# Patient Record
Sex: Female | Born: 2000 | Race: Black or African American | Hispanic: No | Marital: Single
Health system: Southern US, Community
[De-identification: ages and names within clinical notes are randomized; demographics above are authoritative.]

## PROBLEM LIST (undated history)

## (undated) ENCOUNTER — Inpatient Hospital Stay (HOSPITAL_COMMUNITY): Payer: Self-pay

## (undated) DIAGNOSIS — Z8709 Personal history of other diseases of the respiratory system: Secondary | ICD-10-CM

## (undated) DIAGNOSIS — H5010 Unspecified exotropia: Secondary | ICD-10-CM

## (undated) DIAGNOSIS — J302 Other seasonal allergic rhinitis: Secondary | ICD-10-CM

## (undated) DIAGNOSIS — Z789 Other specified health status: Secondary | ICD-10-CM

## (undated) DIAGNOSIS — J020 Streptococcal pharyngitis: Secondary | ICD-10-CM

## (undated) DIAGNOSIS — J45909 Unspecified asthma, uncomplicated: Secondary | ICD-10-CM

---
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
## 2021-10-08 HISTORY — PX: FOOT SURGERY: SHX648

## 2023-12-30 ENCOUNTER — Encounter (HOSPITAL_BASED_OUTPATIENT_CLINIC_OR_DEPARTMENT_OTHER): Payer: Self-pay | Admitting: Emergency Medicine

## 2023-12-30 ENCOUNTER — Emergency Department (HOSPITAL_BASED_OUTPATIENT_CLINIC_OR_DEPARTMENT_OTHER)
Admission: EM | Admit: 2023-12-30 | Discharge: 2023-12-31 | Disposition: A | Discharge status: Home / Self Care | Payer: Self-pay | Attending: Emergency Medicine | Admitting: Emergency Medicine

## 2023-12-30 ENCOUNTER — Other Ambulatory Visit: Payer: Self-pay

## 2023-12-30 DIAGNOSIS — N179 Acute kidney failure, unspecified: Secondary | ICD-10-CM | POA: Insufficient documentation

## 2023-12-30 DIAGNOSIS — R112 Nausea with vomiting, unspecified: Secondary | ICD-10-CM

## 2023-12-30 DIAGNOSIS — K30 Functional dyspepsia: Secondary | ICD-10-CM | POA: Insufficient documentation

## 2023-12-30 DIAGNOSIS — R1012 Left upper quadrant pain: Secondary | ICD-10-CM | POA: Diagnosis present

## 2023-12-30 DIAGNOSIS — E860 Dehydration: Secondary | ICD-10-CM | POA: Diagnosis not present

## 2023-12-30 LAB — COMPREHENSIVE METABOLIC PANEL
ALT: 29 U/L (ref 0–44)
AST: 70 U/L — ABNORMAL HIGH (ref 15–41)
Albumin: 4.7 g/dL (ref 3.5–5.0)
Alkaline Phosphatase: 50 U/L (ref 38–126)
Anion gap: 17 — ABNORMAL HIGH (ref 5–15)
BUN: 17 mg/dL (ref 6–20)
CO2: 19 mmol/L — ABNORMAL LOW (ref 22–32)
Calcium: 9.6 mg/dL (ref 8.9–10.3)
Chloride: 101 mmol/L (ref 98–111)
Creatinine, Ser: 1.3 mg/dL — ABNORMAL HIGH (ref 0.44–1.00)
GFR, Estimated: 60 mL/min — ABNORMAL LOW (ref >60)
Glucose, Bld: 113 mg/dL — ABNORMAL HIGH (ref 70–99)
Potassium: 3.7 mmol/L (ref 3.5–5.1)
Sodium: 137 mmol/L (ref 135–145)
Total Bilirubin: 1.2 mg/dL (ref 0.0–1.2)
Total Protein: 8.2 g/dL — ABNORMAL HIGH (ref 6.5–8.1)

## 2023-12-30 LAB — CBC
HCT: 38.7 % (ref 36.0–46.0)
Hemoglobin: 12.7 g/dL (ref 12.0–15.0)
MCH: 26.7 pg (ref 26.0–34.0)
MCHC: 32.8 g/dL (ref 30.0–36.0)
MCV: 81.3 fL (ref 80.0–100.0)
Platelets: 389 10*3/uL (ref 150–400)
RBC: 4.76 MIL/uL (ref 3.87–5.11)
RDW: 13.8 % (ref 11.5–15.5)
WBC: 13.4 10*3/uL — ABNORMAL HIGH (ref 4.0–10.5)
nRBC: 0 % (ref 0.0–0.2)

## 2023-12-30 LAB — HCG, SERUM, QUALITATIVE: Preg, Serum: NEGATIVE

## 2023-12-30 LAB — LIPASE, BLOOD: Lipase: 33 U/L (ref 11–51)

## 2023-12-30 MED ORDER — LACTATED RINGERS IV BOLUS
1000.0000 mL | Freq: Once | INTRAVENOUS | Status: AC
  Administered 2023-12-30: 1000 mL via INTRAVENOUS

## 2023-12-30 MED ORDER — METOCLOPRAMIDE HCL 5 MG/ML IJ SOLN
10.0000 mg | Freq: Once | INTRAMUSCULAR | Status: AC
  Administered 2023-12-30: 10 mg via INTRAVENOUS
  Filled 2023-12-30: qty 2

## 2023-12-30 MED ORDER — MORPHINE SULFATE (PF) 4 MG/ML IV SOLN
4.0000 mg | Freq: Once | INTRAVENOUS | Status: AC
  Administered 2023-12-30: 4 mg via INTRAVENOUS
  Filled 2023-12-30: qty 1

## 2023-12-30 MED ORDER — ALUM & MAG HYDROXIDE-SIMETH 200-200-20 MG/5ML PO SUSP
30.0000 mL | Freq: Once | ORAL | Status: AC
  Administered 2023-12-30: 30 mL via ORAL
  Filled 2023-12-30: qty 30

## 2023-12-30 MED ORDER — ONDANSETRON 4 MG PO TBDP
4.0000 mg | ORAL_TABLET | Freq: Once | ORAL | Status: AC | PRN
  Administered 2023-12-30: 4 mg via ORAL
  Filled 2023-12-30: qty 1

## 2023-12-30 MED ORDER — HALOPERIDOL LACTATE 5 MG/ML IJ SOLN
5.0000 mg | Freq: Once | INTRAMUSCULAR | Status: AC
  Administered 2023-12-30: 5 mg via INTRAVENOUS
  Filled 2023-12-30: qty 1

## 2023-12-30 NOTE — ED Provider Notes (Signed)
 [place] [facility] Provider Note   CSN: [id number] Arrival date & time: 12/30/23  1703     History  Chief Complaint  Patient presents with   Emesis    Stephanie Glenn is a 23 y.o. female.  HPI 23 year old female presents with left upper quadrant pain and vomiting.  Symptoms started a little bit yesterday but really got bad today.  She states she has been having blood when she vomits today as well as a burning in her left upper quadrant.  No fevers, chest pain, urinary symptoms or diarrhea.  She states she got drunk a few days ago but typically does not drink every day.  She denies any smoking or drug use.  Home Medications Prior to Admission medications   Medication Sig Start Date End Date Taking? Authorizing Provider  cetirizine (ZYRTEC) 10 MG tablet Take 10 mg by mouth daily.    [provider]  FLUoxetine (PROZAC) 10 MG capsule Take 10 mg by mouth daily.    [provider]  ibuprofen (ADVIL,MOTRIN) 600 MG tablet Take 1 tablet (600 mg total) by mouth every 6 (six) hours as needed. 10/04/15   Raeford Razor, MD  nystatin (MYCOSTATIN/NYSTOP) powder Apply topically 4 (four) times daily. 02/16/17   Cristina Gong, PA-C  ondansetron (ZOFRAN) 4 MG tablet Take 1 tablet (4 mg total) by mouth every 6 (six) hours. 09/17/16   Roxy Horseman, PA-C  traMADol (ULTRAM) 50 MG tablet Take 1 tablet (50 mg total) by mouth every 6 (six) hours as needed. 10/04/15   Raeford Razor, MD      Allergies    Patient has no known allergies.    Review of Systems   Review of Systems  Constitutional:  Negative for fever.  Respiratory:  Negative for shortness of breath.   Cardiovascular:  Negative for chest pain.  Gastrointestinal:  Positive for abdominal pain, nausea and vomiting. Negative for diarrhea.  Genitourinary:  Negative for dysuria.    Physical Exam Updated Vital Signs BP 129/74   Pulse 68   Temp 98.1 F (36.7 C)   Resp 18   Ht  5\' 1"  (1.549 m)   Wt 74.8 kg   LMP 11/14/2023 (Approximate)   SpO2 100%   BMI 31.18 kg/m  Physical Exam Vitals and nursing note reviewed.  Constitutional:      General: She is not in acute distress.    Appearance: She is well-developed. She is not ill-appearing or diaphoretic.  HENT:     Head: Normocephalic and atraumatic.  Cardiovascular:     Rate and Rhythm: Normal rate and regular rhythm.     Heart sounds: Normal heart sounds.  Pulmonary:     Effort: Pulmonary effort is normal.     Breath sounds: Normal breath sounds.  Abdominal:     Palpations: Abdomen is soft.     Tenderness: There is abdominal tenderness in the left upper quadrant.  Skin:    General: Skin is warm and dry.  Neurological:     Mental Status: She is alert.     ED Results / Procedures / Treatments   Labs (all labs ordered are listed, but only abnormal results are displayed) Labs Reviewed  COMPREHENSIVE METABOLIC PANEL - Abnormal; Notable for the following components:      Result Value   CO2 19 (*)    Glucose, Bld 113 (*)    Creatinine, Ser 1.30 (*)    Total Protein 8.2 (*)    AST 70 (*)  GFR, Estimated 60 (*)    Anion gap 17 (*)    All other components within normal limits  CBC - Abnormal; Notable for the following components:   WBC 13.4 (*)    All other components within normal limits  LIPASE, BLOOD  HCG, SERUM, QUALITATIVE  URINALYSIS, ROUTINE W REFLEX MICROSCOPIC  PREGNANCY, URINE  RAPID URINE DRUG SCREEN, HOSP PERFORMED    EKG EKG Interpretation Date/Time:  Monday December 30 2023 22:30:37 EDT Ventricular Rate:  53 PR Interval:  122 QRS Duration:  140 QT Interval:  492 QTC Calculation: 462 R Axis:   76  Text Interpretation: Sinus rhythm Nonspecific intraventricular conduction delay Tachycardia is resolved when compared to earlier in the day Confirmed by Pricilla Loveless [phone]) on 12/30/2023 10:31:59 PM  Radiology No results found.  Procedures Procedures    Medications Ordered  in ED Medications  ondansetron (ZOFRAN-ODT) disintegrating tablet 4 mg (4 mg Oral Given 12/30/23 1718)  lactated ringers bolus 1,000 mL (1,000 mLs Intravenous New Bag/Given 12/30/23 2156)  morphine (PF) 4 MG/ML injection 4 mg (4 mg Intravenous Given 12/30/23 2150)  metoCLOPramide (REGLAN) injection 10 mg (10 mg Intravenous Given 12/30/23 2149)  lactated ringers bolus 1,000 mL (1,000 mLs Intravenous New Bag/Given 12/30/23 2156)  alum & mag hydroxide-simeth (MAALOX/MYLANTA) 200-200-20 MG/5ML suspension 30 mL (30 mLs Oral Given 12/30/23 2250)  haloperidol lactate (HALDOL) injection 5 mg (5 mg Intravenous Given 12/30/23 2250)    ED Course/ Medical Decision Making/ A&P                                 Medical Decision Making Amount and/or Complexity of Data Reviewed External Data Reviewed: labs, radiology and notes.    Details: Negative CT 12/07/23 Labs: ordered.    Details: Mild AKI ECG/medicine tests: ordered and independent interpretation performed.    Details: Normal QTC when HR normal  Risk OTC drugs. Prescription drug management.   Patient presents with vomiting.  She reports hematemesis though more likely this was a Mallory-Weiss injury given her normal BUN, normal hemoglobin compared to earlier in the month, and no hematemesis here in the emergency department.  She was given morphine and Reglan though still does not feel great and has continued to complain of left upper quadrant pain.  She had a negative CT with what sounds like similar symptoms earlier in the month at an outside ER [facility].  Will give her IV Haldol and a GI cocktail and she will need reassessment. Care transferred to Dr. Wallace Cullens.        Final Clinical Impression(s) / ED Diagnoses Final diagnoses:  None    Rx / DC Orders ED Discharge Orders     None         Pricilla Loveless, MD 12/30/23 2317

## 2023-12-30 NOTE — ED Notes (Signed)
 Informed pt of need for urine sample, states unable to go at this time, will ctm

## 2023-12-30 NOTE — ED Triage Notes (Signed)
 Pt BIBA from work- c/o vomitting after eating wing stop, reports L side abd pain/burning.  Denies known sick contacts.    While attempting to take pt to triage room from hallway, pt states she's feeling faint, sits on floor, assisted to stand and directed to sit in chair, pt proceeds to assume fetal position in chair.  When asked to sit in chair appropriately, pt refuses saying she is in too much pain.    Pt then asks if she is going straight to room, RN informed her there are no rooms available, pt reports "fine, Ill probably just go home"

## 2023-12-30 NOTE — ED Notes (Signed)
 Patient witnessed sticking her finger down her throat while in the lobby.  Patient removed finger after making eye contact with this RN.

## 2023-12-31 LAB — URINALYSIS, ROUTINE W REFLEX MICROSCOPIC
Glucose, UA: NEGATIVE mg/dL
Hgb urine dipstick: NEGATIVE
Ketones, ur: >=80 mg/dL — AB
Leukocytes,Ua: NEGATIVE
Nitrite: NEGATIVE
Protein, ur: 100 mg/dL — AB
Specific Gravity, Urine: 1.02 (ref 1.005–1.030)
pH: 7 (ref 5.0–8.0)

## 2023-12-31 LAB — URINALYSIS, MICROSCOPIC (REFLEX)

## 2023-12-31 LAB — RAPID URINE DRUG SCREEN, HOSP PERFORMED
Amphetamines: NOT DETECTED
Barbiturates: NOT DETECTED
Benzodiazepines: NOT DETECTED
Cocaine: NOT DETECTED
Opiates: POSITIVE — AB
Tetrahydrocannabinol: POSITIVE — AB

## 2023-12-31 LAB — PREGNANCY, URINE: Preg Test, Ur: NEGATIVE

## 2023-12-31 MED ORDER — PANTOPRAZOLE SODIUM 20 MG PO TBEC: 20.0000 mg | DELAYED_RELEASE_TABLET | Freq: Every day | ORAL | 0 refills | Status: DC

## 2023-12-31 MED ORDER — ONDANSETRON HCL 4 MG PO TABS: 4.0000 mg | ORAL_TABLET | Freq: Three times a day (TID) | ORAL | 0 refills | Status: DC | PRN

## 2023-12-31 MED ORDER — SUCRALFATE 1 G PO TABS: 1.0000 g | ORAL_TABLET | Freq: Three times a day (TID) | ORAL | 0 refills | Status: DC

## 2023-12-31 MED ORDER — CALCIUM CARBONATE ANTACID 500 MG PO CHEW
2.0000 | CHEWABLE_TABLET | Freq: Once | ORAL | Status: AC
  Administered 2023-12-31: 400 mg via ORAL
  Filled 2023-12-31: qty 2

## 2023-12-31 NOTE — ED Provider Notes (Signed)
  Provider Note MRN:  [id number]  Arrival date & time: 12/31/23    ED Course and Medical Decision Making   See note from prior team for complete details, in brief:  Clinical Course as of 12/31/23 0205  Mon Dec 30, 2023  2317 Handoff SG 23 yo female here w/ n/v/dehydration Some improvement w/ reglan/morphine/haldol/fluids Pending po chall    [SG]  Tue Dec 31, 2023  0132 Feeling better on recheck, tolerating PO. No further emesis. Has a burning sensation to epigastrium and back of throat, give tums. Stable for dc [SG]    Clinical Course User Index [SG] Sloan Leiter, DO   Reviewed CTAP from 12/07/23 - stable Cr from 12/07/23 was 0.99   The patient improved significantly and was discharged in stable condition. Detailed discussions were had with the patient/guardian regarding current findings, and need for close f/u with PCP or on call doctor. The patient/guardian has been instructed to return immediately if the symptoms worsen in any way for re-evaluation. Patient/guardian verbalized understanding and is in agreement with current care plan. All questions answered prior to discharge.      Procedures  Final Clinical Impressions(s) / ED Diagnoses     ICD-10-CM   1. Nausea and vomiting, unspecified vomiting type  R11.2     2. Indigestion  K30     3. Dehydration  E86.0       ED Discharge Orders          Ordered    sucralfate (CARAFATE) 1 g tablet  3 times daily with meals        12/31/23 0203    pantoprazole (PROTONIX) 20 MG tablet  Daily        12/31/23 0203    ondansetron (ZOFRAN) 4 MG tablet  Every 8 hours PRN        12/31/23 0203              Discharge Instructions      It was a pleasure caring for you today in the emergency department.  You should return to the hospital if you experience return of persistent nausea and vomiting that does not resolve and does not allow you to tolerate any food or fluids, persistent fevers for greater than 2-3 more days,  increasing abdominal pain that persists despite medications, persistent diarrhea, dizziness, syncope (fainting), or for any other concerns.    Please return to the emergency department immediately for any new or concerning symptoms, or if you get worse.         Sloan Leiter, DO 12/31/23 0205

## 2023-12-31 NOTE — Discharge Instructions (Addendum)
It was a pleasure caring for you today in the emergency department. ? ?You should return to the hospital if you experience return of persistent nausea and vomiting that does not resolve and does not allow you to tolerate any food or fluids, persistent fevers for greater than 2-3 more days, increasing abdominal pain that persists despite medications, persistent diarrhea, dizziness, syncope (fainting), or for any other concerns.  ?  ?Please return to the emergency department immediately for any new or concerning symptoms, or if you get worse. ?

## 2023-12-31 NOTE — ED Notes (Signed)
 Pt declines PO challenge, states she is "afraid to eat or drink" and that her stomach still hurts; provider notified  Pt informed of need for urine sample, still reports not need to go at this time

## 2024-01-02 ENCOUNTER — Emergency Department (HOSPITAL_BASED_OUTPATIENT_CLINIC_OR_DEPARTMENT_OTHER): Payer: Self-pay

## 2024-01-02 ENCOUNTER — Inpatient Hospital Stay (HOSPITAL_BASED_OUTPATIENT_CLINIC_OR_DEPARTMENT_OTHER)
Admission: EM | Admit: 2024-01-02 | Discharge: 2024-01-04 | DRG: 392 | Disposition: A | Discharge status: Home / Self Care | Payer: Self-pay | Attending: Family Medicine | Admitting: Family Medicine

## 2024-01-02 ENCOUNTER — Other Ambulatory Visit: Payer: Self-pay

## 2024-01-02 DIAGNOSIS — R824 Acetonuria: Secondary | ICD-10-CM | POA: Diagnosis present

## 2024-01-02 DIAGNOSIS — E876 Hypokalemia: Secondary | ICD-10-CM | POA: Diagnosis present

## 2024-01-02 DIAGNOSIS — F19139 Other psychoactive substance abuse with withdrawal, unspecified: Secondary | ICD-10-CM | POA: Diagnosis present

## 2024-01-02 DIAGNOSIS — Z1152 Encounter for screening for COVID-19: Secondary | ICD-10-CM

## 2024-01-02 DIAGNOSIS — E860 Dehydration: Secondary | ICD-10-CM | POA: Diagnosis present

## 2024-01-02 DIAGNOSIS — Z79899 Other long term (current) drug therapy: Secondary | ICD-10-CM

## 2024-01-02 DIAGNOSIS — R079 Chest pain, unspecified: Secondary | ICD-10-CM | POA: Diagnosis present

## 2024-01-02 DIAGNOSIS — R109 Unspecified abdominal pain: Secondary | ICD-10-CM | POA: Diagnosis present

## 2024-01-02 DIAGNOSIS — R1012 Left upper quadrant pain: Secondary | ICD-10-CM

## 2024-01-02 DIAGNOSIS — M6282 Rhabdomyolysis: Secondary | ICD-10-CM | POA: Diagnosis present

## 2024-01-02 DIAGNOSIS — R112 Nausea with vomiting, unspecified: Principal | ICD-10-CM | POA: Diagnosis present

## 2024-01-02 DIAGNOSIS — A084 Viral intestinal infection, unspecified: Principal | ICD-10-CM | POA: Diagnosis present

## 2024-01-02 DIAGNOSIS — K529 Noninfective gastroenteritis and colitis, unspecified: Secondary | ICD-10-CM | POA: Diagnosis present

## 2024-01-02 LAB — CBC
HCT: 36.5 % (ref 36.0–46.0)
Hemoglobin: 11.9 g/dL — ABNORMAL LOW (ref 12.0–15.0)
MCH: 26.6 pg (ref 26.0–34.0)
MCHC: 32.6 g/dL (ref 30.0–36.0)
MCV: 81.7 fL (ref 80.0–100.0)
Platelets: 314 K/uL (ref 150–400)
RBC: 4.47 MIL/uL (ref 3.87–5.11)
RDW: 13.8 % (ref 11.5–15.5)
WBC: 10.4 K/uL (ref 4.0–10.5)
nRBC: 0 % (ref 0.0–0.2)

## 2024-01-02 LAB — URINALYSIS, MICROSCOPIC (REFLEX): WBC, UA: NONE SEEN WBC/hpf (ref 0–5)

## 2024-01-02 LAB — COMPREHENSIVE METABOLIC PANEL WITH GFR
ALT: 30 U/L (ref 0–44)
AST: 48 U/L — ABNORMAL HIGH (ref 15–41)
Albumin: 4.2 g/dL (ref 3.5–5.0)
Alkaline Phosphatase: 46 U/L (ref 38–126)
Anion gap: 13 (ref 5–15)
BUN: 8 mg/dL (ref 6–20)
CO2: 19 mmol/L — ABNORMAL LOW (ref 22–32)
Calcium: 9.3 mg/dL (ref 8.9–10.3)
Chloride: 105 mmol/L (ref 98–111)
Creatinine, Ser: 0.87 mg/dL (ref 0.44–1.00)
GFR, Estimated: >60 mL/min
Glucose, Bld: 111 mg/dL — ABNORMAL HIGH (ref 70–99)
Potassium: 3.2 mmol/L — ABNORMAL LOW (ref 3.5–5.1)
Sodium: 137 mmol/L (ref 135–145)
Total Bilirubin: 0.8 mg/dL (ref 0.0–1.2)
Total Protein: 7.5 g/dL (ref 6.5–8.1)

## 2024-01-02 LAB — RESP PANEL BY RT-PCR (RSV, FLU A&B, COVID)  RVPGX2
Influenza A by PCR: NEGATIVE
Influenza B by PCR: NEGATIVE
Resp Syncytial Virus by PCR: NEGATIVE
SARS Coronavirus 2 by RT PCR: NEGATIVE

## 2024-01-02 LAB — LIPASE, BLOOD: Lipase: 26 U/L (ref 11–51)

## 2024-01-02 LAB — BASIC METABOLIC PANEL WITH GFR
Anion gap: 8 (ref 5–15)
BUN: 6 mg/dL (ref 6–20)
CO2: 21 mmol/L — ABNORMAL LOW (ref 22–32)
Calcium: 8.9 mg/dL (ref 8.9–10.3)
Chloride: 107 mmol/L (ref 98–111)
Creatinine, Ser: 0.92 mg/dL (ref 0.44–1.00)
GFR, Estimated: >60 mL/min (ref >60)
Glucose, Bld: 97 mg/dL (ref 70–99)
Potassium: 3.2 mmol/L — ABNORMAL LOW (ref 3.5–5.1)
Sodium: 136 mmol/L (ref 135–145)

## 2024-01-02 LAB — URINALYSIS, ROUTINE W REFLEX MICROSCOPIC
Bilirubin Urine: NEGATIVE
Glucose, UA: NEGATIVE mg/dL
Hgb urine dipstick: NEGATIVE
Ketones, ur: 40 mg/dL — AB
Leukocytes,Ua: NEGATIVE
Nitrite: NEGATIVE
Protein, ur: 100 mg/dL — AB
Specific Gravity, Urine: 1.02 (ref 1.005–1.030)
pH: >=9 (ref 5.0–8.0)

## 2024-01-02 LAB — PHOSPHORUS: Phosphorus: 3 mg/dL (ref 2.5–4.6)

## 2024-01-02 LAB — ETHANOL: Alcohol, Ethyl (B): <10 mg/dL (ref <10)

## 2024-01-02 LAB — MAGNESIUM: Magnesium: 1.9 mg/dL (ref 1.7–2.4)

## 2024-01-02 LAB — LACTIC ACID, PLASMA: Lactic Acid, Venous: 0.8 mmol/L (ref 0.5–1.9)

## 2024-01-02 LAB — PREGNANCY, URINE: Preg Test, Ur: NEGATIVE

## 2024-01-02 LAB — CK: Total CK: 1267 U/L — ABNORMAL HIGH (ref 38–234)

## 2024-01-02 LAB — TSH: TSH: 0.797 u[IU]/mL (ref 0.350–4.500)

## 2024-01-02 MED ORDER — DIPHENHYDRAMINE HCL 50 MG/ML IJ SOLN
25.0000 mg | Freq: Once | INTRAMUSCULAR | Status: AC
  Administered 2024-01-02: 25 mg via INTRAVENOUS
  Filled 2024-01-02: qty 1

## 2024-01-02 MED ORDER — PANTOPRAZOLE SODIUM 40 MG IV SOLR
40.0000 mg | Freq: Once | INTRAVENOUS | Status: AC
  Administered 2024-01-02: 40 mg via INTRAVENOUS
  Filled 2024-01-02: qty 10

## 2024-01-02 MED ORDER — ACETAMINOPHEN 650 MG RE SUPP: 650.0000 mg | Freq: Four times a day (QID) | RECTAL | Status: DC | PRN

## 2024-01-02 MED ORDER — HYDROCODONE-ACETAMINOPHEN 5-325 MG PO TABS
1.0000 | ORAL_TABLET | ORAL | Status: DC | PRN
  Administered 2024-01-02: 2 via ORAL
  Filled 2024-01-02: qty 2

## 2024-01-02 MED ORDER — ALUM & MAG HYDROXIDE-SIMETH 200-200-20 MG/5ML PO SUSP
30.0000 mL | Freq: Once | ORAL | Status: AC
  Administered 2024-01-02: 30 mL via ORAL
  Filled 2024-01-02: qty 30

## 2024-01-02 MED ORDER — PANTOPRAZOLE SODIUM 40 MG IV SOLR
40.0000 mg | Freq: Two times a day (BID) | INTRAVENOUS | Status: DC
  Administered 2024-01-02 – 2024-01-04 (×4): 40 mg via INTRAVENOUS
  Filled 2024-01-02 (×4): qty 10

## 2024-01-02 MED ORDER — SODIUM CHLORIDE 0.9 % IV SOLN: INTRAVENOUS | Status: AC

## 2024-01-02 MED ORDER — LACTATED RINGERS IV BOLUS
1000.0000 mL | Freq: Once | INTRAVENOUS | Status: AC
  Administered 2024-01-02: 1000 mL via INTRAVENOUS

## 2024-01-02 MED ORDER — IOHEXOL 300 MG/ML  SOLN
100.0000 mL | Freq: Once | INTRAMUSCULAR | Status: AC | PRN
  Administered 2024-01-02: 100 mL via INTRAVENOUS

## 2024-01-02 MED ORDER — DROPERIDOL 2.5 MG/ML IJ SOLN
0.6250 mg | Freq: Once | INTRAMUSCULAR | Status: AC
  Administered 2024-01-02: 0.625 mg via INTRAVENOUS
  Filled 2024-01-02: qty 2

## 2024-01-02 MED ORDER — ONDANSETRON HCL 4 MG PO TABS: 4.0000 mg | ORAL_TABLET | Freq: Four times a day (QID) | ORAL | Status: DC | PRN

## 2024-01-02 MED ORDER — METOCLOPRAMIDE HCL 5 MG/ML IJ SOLN
5.0000 mg | Freq: Once | INTRAMUSCULAR | Status: AC
  Administered 2024-01-02: 5 mg via INTRAVENOUS
  Filled 2024-01-02: qty 2

## 2024-01-02 MED ORDER — SUCRALFATE 1 G PO TABS
1.0000 g | ORAL_TABLET | Freq: Three times a day (TID) | ORAL | Status: DC
Start: 2024-01-03 — End: 2024-01-04
  Administered 2024-01-03: 1 g via ORAL
  Filled 2024-01-02 (×3): qty 1

## 2024-01-02 MED ORDER — METOCLOPRAMIDE HCL 5 MG/ML IJ SOLN
5.0000 mg | Freq: Four times a day (QID) | INTRAMUSCULAR | Status: DC | PRN
  Filled 2024-01-02: qty 2

## 2024-01-02 MED ORDER — ONDANSETRON HCL 4 MG/2ML IJ SOLN
4.0000 mg | Freq: Four times a day (QID) | INTRAMUSCULAR | Status: DC | PRN
  Administered 2024-01-02 – 2024-01-04 (×3): 4 mg via INTRAVENOUS
  Filled 2024-01-02 (×3): qty 2

## 2024-01-02 MED ORDER — ONDANSETRON 4 MG PO TBDP: 4.0000 mg | ORAL_TABLET | Freq: Once | ORAL | Status: DC | PRN

## 2024-01-02 MED ORDER — ACETAMINOPHEN 325 MG PO TABS: 650.0000 mg | ORAL_TABLET | Freq: Four times a day (QID) | ORAL | Status: DC | PRN

## 2024-01-02 MED ORDER — POTASSIUM CHLORIDE CRYS ER 20 MEQ PO TBCR
40.0000 meq | EXTENDED_RELEASE_TABLET | Freq: Once | ORAL | Status: AC
  Administered 2024-01-02: 40 meq via ORAL
  Filled 2024-01-02: qty 2

## 2024-01-02 MED ORDER — TRAMADOL HCL 50 MG PO TABS: 50.0000 mg | ORAL_TABLET | Freq: Four times a day (QID) | ORAL | Status: DC | PRN

## 2024-01-02 NOTE — Assessment & Plan Note (Signed)
 rehydrate and follow fluid status

## 2024-01-02 NOTE — Assessment & Plan Note (Signed)
 Potassium supplement given at outside ER repeat labs and continue to replace as needed

## 2024-01-02 NOTE — ED Triage Notes (Signed)
 Pt POV steady gait- c/o ongoing abd pain, emesis, chest pain, light headed, chills.  Reports recently seen for same, unable to keep meds down.    Poor po intake.

## 2024-01-02 NOTE — ED Provider Notes (Signed)
 [place] [facility] Provider Note   CSN: [id number] Arrival date & time: 01/02/24  1311     History  Chief Complaint  Patient presents with   Abdominal Pain   Chest Pain    Stephanie Glenn is a 23 y.o. female with past medical history of history of asthma presenting with epigastric tenderness.  Patient reports she has intermittently been dealing with this for approximately 1 week seems to be getting worse.  Was seen here yesterday for similar.  Symptoms resolved after Haldol.  She is no longer tolerating oral intake today.  Reports she has had several episodes of nonbilious nonbloody emesis today.  Reports this initially tries to drink or eat anything she immediately vomits it back up.  Seems to have noted small blood in vomit yesterday.  No melena or hematochezia.  Pain is intermittent.  Nausea is constant.  Patient actively retching in room.  Denies fevers, chills.  Denies any recent travel.  Denies recent antibiotic use.  Denies any chest pain, shortness of breath.    Abdominal Pain Associated symptoms: no chest pain   Chest Pain Associated symptoms: abdominal pain        Home Medications Prior to Admission medications   Medication Sig Start Date End Date Taking? Authorizing Provider  cetirizine (ZYRTEC) 10 MG tablet Take 10 mg by mouth daily.    [provider]  FLUoxetine (PROZAC) 10 MG capsule Take 10 mg by mouth daily.    [provider]  ibuprofen (ADVIL,MOTRIN) 600 MG tablet Take 1 tablet (600 mg total) by mouth every 6 (six) hours as needed. 10/04/15   Raeford Razor, MD  nystatin (MYCOSTATIN/NYSTOP) powder Apply topically 4 (four) times daily. 02/16/17   Cristina Gong, PA-C  ondansetron (ZOFRAN) 4 MG tablet Take 1 tablet (4 mg total) by mouth every 8 (eight) hours as needed. 12/31/23   Sloan Leiter, DO  pantoprazole (PROTONIX) 20 MG tablet Take 1 tablet (20 mg total) by mouth daily for 14 days. 12/31/23 01/14/24   Sloan Leiter, DO  sucralfate (CARAFATE) 1 g tablet Take 1 tablet (1 g total) by mouth with breakfast, with lunch, and with evening meal for 7 days. 12/31/23 01/07/24  Sloan Leiter, DO  traMADol (ULTRAM) 50 MG tablet Take 1 tablet (50 mg total) by mouth every 6 (six) hours as needed. 10/04/15   Raeford Razor, MD      Allergies    Patient has no known allergies.    Review of Systems   Review of Systems  Cardiovascular:  Negative for chest pain.  Gastrointestinal:  Positive for abdominal pain.    Physical Exam Updated Vital Signs BP 132/80   Pulse 77   Temp 98.7 F (37.1 C)   Resp 15   Ht 5\' 1"  (1.549 m)   Wt 70.8 kg   LMP 11/14/2023 (Approximate)   SpO2 100%   BMI 29.48 kg/m  Physical Exam Vitals and nursing note reviewed.  Constitutional:      General: She is not in acute distress.    Appearance: She is not toxic-appearing.  HENT:     Head: Normocephalic and atraumatic.  Eyes:     General: No scleral icterus.    Conjunctiva/sclera: Conjunctivae normal.  Cardiovascular:     Rate and Rhythm: Normal rate and regular rhythm.     Pulses: Normal pulses.     Heart sounds: Normal heart sounds.  Pulmonary:     Effort: Pulmonary effort is  normal. No respiratory distress.     Breath sounds: Normal breath sounds.  Abdominal:     General: Abdomen is flat. Bowel sounds are normal. There is no distension.     Palpations: Abdomen is soft. There is no mass.     Tenderness: There is no abdominal tenderness.     Comments: Redness is not reproducible on exam.  Has generalized abdominal pain.  Musculoskeletal:     Right lower leg: No edema.     Left lower leg: No edema.  Skin:    General: Skin is warm and dry.     Findings: No lesion.  Neurological:     General: No focal deficit present.     Mental Status: She is alert and oriented to person, place, and time. Mental status is at baseline.     ED Results / Procedures / Treatments   Labs (all labs ordered are listed, but only  abnormal results are displayed) Labs Reviewed  COMPREHENSIVE METABOLIC PANEL WITH GFR - Abnormal; Notable for the following components:      Result Value   Potassium 3.2 (*)    CO2 19 (*)    Glucose, Bld 111 (*)    AST 48 (*)    All other components within normal limits  CBC - Abnormal; Notable for the following components:   Hemoglobin 11.9 (*)    All other components within normal limits  URINALYSIS, ROUTINE W REFLEX MICROSCOPIC - Abnormal; Notable for the following components:   Ketones, ur 40 (*)    Protein, ur 100 (*)    All other components within normal limits  URINALYSIS, MICROSCOPIC (REFLEX) - Abnormal; Notable for the following components:   Bacteria, UA RARE (*)    All other components within normal limits  RESP PANEL BY RT-PCR (RSV, FLU A&B, COVID)  RVPGX2  LIPASE, BLOOD  PREGNANCY, URINE  MAGNESIUM    EKG None  Radiology CT ABDOMEN PELVIS W CONTRAST Result Date: 01/02/2024 CLINICAL DATA:  Abdominal pain. EXAM: CT ABDOMEN AND PELVIS WITH CONTRAST TECHNIQUE: Multidetector CT imaging of the abdomen and pelvis was performed using the standard protocol following bolus administration of intravenous contrast. RADIATION DOSE REDUCTION: This exam was performed according to the departmental dose-optimization program which includes automated exposure control, adjustment of the mA and/or kV according to patient size and/or use of iterative reconstruction technique. CONTRAST:  OMNIPAQUE IOHEXOL 300 MG/ML  SOLN COMPARISON:  CT abdomen pelvis dated 12/07/2023. FINDINGS: Lower chest: The visualized lung bases are clear. No intra-abdominal free air or free fluid. Hepatobiliary: Pancreas: Unremarkable. No pancreatic ductal dilatation or surrounding inflammatory changes. Spleen: Normal in size without focal abnormality. Adrenals/Urinary Tract: The adrenal glands, kidneys, visualized ureters, and urinary bladder appear unremarkable. Stomach/Bowel: No bowel obstruction or active  inflammation. The appendix is normal. Vascular/Lymphatic: The abdominal aorta and IVC are unremarkable. No portal venous gas. There is no adenopathy. Reproductive: The uterus is anteverted. A 3 cm left ovarian dominant follicle or corpus luteum. No imaging follow-up. The right ovary is unremarkable. Other: None Musculoskeletal: No acute or significant osseous findings. IMPRESSION: 1. No acute intra-abdominal or pelvic pathology. 2. A 3 cm left ovarian dominant follicle or corpus luteum. No imaging follow-up. Electronically Signed   By: Elgie Collard M.D.   On: 01/02/2024 18:58   DG Chest 2 View Result Date: 01/02/2024 CLINICAL DATA:  Chest pain emesis EXAM: CHEST - 2 VIEW COMPARISON:  None Available. FINDINGS: The heart size and mediastinal contours are within normal limits. Both lungs  are clear. The visualized skeletal structures are unremarkable. IMPRESSION: No active cardiopulmonary disease. Electronically Signed   By: Jasmine Pang M.D.   On: 01/02/2024 16:30    Procedures Procedures    Medications Ordered in ED Medications  pantoprazole (PROTONIX) injection 40 mg (40 mg Intravenous Given 01/02/24 1627)  metoCLOPramide (REGLAN) injection 5 mg (5 mg Intravenous Given 01/02/24 1633)  lactated ringers bolus 1,000 mL (1,000 mLs Intravenous New Bag/Given 01/02/24 1635)  droperidol (INAPSINE) 2.5 MG/ML injection 0.625 mg (0.625 mg Intravenous Given 01/02/24 1712)  diphenhydrAMINE (BENADRYL) injection 25 mg (25 mg Intravenous Given 01/02/24 1711)  potassium chloride SA (KLOR-CON M) CR tablet 40 mEq (40 mEq Oral Given 01/02/24 1826)  alum & mag hydroxide-simeth (MAALOX/MYLANTA) 200-200-20 MG/5ML suspension 30 mL (30 mLs Oral Given 01/02/24 1826)  iohexol (OMNIPAQUE) 300 MG/ML solution 100 mL (100 mLs Intravenous Contrast Given 01/02/24 1802)    ED Course/ Medical Decision Making/ A&P Clinical Course as of 01/02/24 1909  Thu Jan 02, 2024  1909 Threw up potassium pills. Not tolerating PO, not feeling  better.  [JB]    Clinical Course User Index [JB] Nerissa Constantin, Horald Chestnut, PA-C                                 Medical Decision Making Amount and/or Complexity of Data Reviewed Labs: ordered. Radiology: ordered.  Risk OTC drugs. Prescription drug management. Decision regarding hospitalization.   This patient presents to the ED for concern of nausea, vomit, this involves an extensive number of treatment options, and is a complaint that carries with it a high risk of complications and morbidity.  The differential diagnosis includes pancreatitis, gastritis, gastroenteritis, cholecystitis, choledocholithiasis, small bowel obstruction, electrolyte abnormality, dehydration, medication side effect, appendicitis   Co morbidities that complicate the patient evaluation  Asthma, allergies    Additional history obtained:  Additional history obtained from 01/01/24 seen here yesterday for similar complaint.  Reports she drank alcohol a few days ago.  Apparently was denying cannabis use at this time.  Had EKG but no imaging.  Felt better after receiving Haldol.   Lab Tests:  I personally interpreted labs.  The pertinent results include:   CBC with no leukocyte ptosis, hemoglobin is 11.9.  CMP significant for AST 48, and potassium 3.2 will supplement orally when tolerating oral intake.  Magnesium normal.  Lipase normal pregnancy test negative.  Urine is positive for small amount of ketones and protein which I feel is her to starvation ketosis.   Imaging Studies ordered:  I ordered imaging studies including chest x-ray ordered prior to seeing patient, ct abd/pelvis   I independently visualized and interpreted imaging which showed chest x-ray normal, CT negative   I agree with the radiologist interpretation   Cardiac Monitoring: / EKG:  The patient was maintained on a cardiac monitor.  I personally viewed and interpreted the cardiac monitored which showed an underlying rhythm of: Sinus, normal  QT   Problem List / ED Course / Critical interventions / Medication management  Patient stable and well-appearing.  She has no focal area of abdominal tenderness on exam.  Due to durations of symptoms will obtain imaging.  Labs show no leukocytosis.  She has a potassium of 3.2 EKG without QT prolongation.  Will give fluids for ketones in urine, concern for dehydration and will try to control symptoms.  I ordered medication including initially giving Reglan, Protonix, fluids.  Patient reassessed and was  not feeling better after receiving Reglan continues to have retching and vomiting.  Will give droperidol.  She has normal QT. Patient reassessed after receiving droperidol.  Reports she throw up potassium and is not feeling better at all.  Not tolerating oral intake.  At this point she has had several episodes for similar over the past 4 to 5 days.  She continues to not tolerate oral intake.  Feel she would benefit from obs admission for intractable nausea vomiting. I have reviewed the patients home medicines and have made adjustments as needed   Plan  Admit for intractable nausea vomiting.         Final Clinical Impression(s) / ED Diagnoses Final diagnoses:  Intractable nausea and vomiting    Rx / DC Orders ED Discharge Orders     None         Smitty Knudsen, PA-C 01/02/24 2128    Virgina Norfolk, DO 01/02/24 2244

## 2024-01-02 NOTE — Plan of Care (Signed)
 Plan of Care Note for accepted transfer  Patient: Stephanie Glenn              ZOX:[id number]  DOA: 01/02/2024     Facility requesting transfer: [facility] Requesting Provider: Jasmine Pang, PA  Reason for transfer: Intractable nausea and vomiting, hypokalemia and non-anion gap metabolic acidosis.  Facility course: Stephanie Glenn is a 23 y.o. female with past medical history of history of asthma presenting with epigastric tenderness.  Patient reports she has intermittently been dealing with this for approximately 1 week seems to be getting worse.  Was seen here yesterday for similar.  Symptoms resolved after Haldol.  She is no longer tolerating oral intake today.  Reports she has had several episodes of nonbilious nonbloody emesis today.  Reports this initially tries to drink or eat anything she immediately vomits it back up.  Seems to have noted small blood in vomit yesterday.  No melena or hematochezia.  Pain is intermittent.  Nausea is constant.  Patient actively retching in room.  Denies fevers, chills.  Denies any recent travel.  Denies recent antibiotic use.  Denies any chest pain, shortness of breat   Hemodynamically stable at the ED. CBC unremarkable except slightly low hemoglobin 11.9.  Baseline hemoglobin around 12.7. CMP showing low potassium 3.2, low bicarb 19, elevated AST 48 otherwise unremarkable.  Normal lipase level. Normal mag level.  Pregnancy test negative.  Respiratory panel negative. UA showing positive creatinine and protein positive.   EKG showing normal sinus rhythm heart rate 61.  CT abdomen pelvis no acute intra-abdominal or pelvic pathology.  3 cm left ovarian dominant follicle/corpus luteum.  Chest x-ray unremarkable.  In the ED patient has been given GI cocktail 1 L of LR bolus and oral KCl.  [facility] has been consulted for for admission for intractable nausea and vomiting.   Plan of care: The patient is accepted for admission for observation status to  Med-surg  unit, at [facility].  [facility] will assume care on arrival to accepting facility. Until arrival, care as per EDP. However, [facility] available 24/7 for questions and assistance.   Check [email or web address] for on-call coverage.   Nursing staff, please call [facility] Admits & Consults System-Wide number under Amion on patient's arrival so appropriate admitting provider can evaluate the pt.    Author: Tereasa Coop, MD  01/02/2024  [facility]

## 2024-01-02 NOTE — ED Notes (Signed)
 ED TO INPATIENT HANDOFF REPORT  ED Nurse Name and Phone #: Hayden Pedro RN [phone]  S Name/Age/Gender Stephanie Glenn 23 y.o. female Room/Bed: MH02/MH02  Code Status   Code Status: Not on file  Home/SNF/Other Home Patient oriented to: self, place, time, and situation Is this baseline? Yes   Triage Complete: Triage complete  Chief Complaint Intractable nausea and vomiting [R11.2]  Triage Note Pt POV steady gait- c/o ongoing abd pain, emesis, chest pain, light headed, chills.  Reports recently seen for same, unable to keep meds down.    Poor po intake.    Allergies No Known Allergies  Level of Care/Admitting Diagnosis ED Disposition     ED Disposition  Admit   Condition  --   Comment  Hospital Area: [facility] [[id number]]  Level of Care: Med-Surg [16]  Interfacility transfer: Yes  May place patient in observation at [facility] or [facility] if equivalent level of care is available:: Yes  Covid Evaluation: Asymptomatic - no recent exposure (last 10 days) testing not required  Diagnosis: Intractable nausea and vomiting [720114]  Admitting Physician: Tereasa Coop [[id number]]  Attending Physician: Tereasa Coop [[id number]]          B Medical/Surgery History Past Medical History:  Diagnosis Date   Asthma    Difficult intravenous access    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   History of asthma    as a child   Seasonal allergies    Strep throat    Past Surgical History:  Procedure Laterality Date   STRABISMUS SURGERY Bilateral 07/16/2014   Procedure: REPAIR STRABISMUS PEDIATRIC;  Surgeon: Shara Blazing, MD;  Location: [facility];  Service: Ophthalmology;  Laterality: Bilateral;     A IV Location/Drains/Wounds Patient Lines/Drains/Airways Status     Active Line/Drains/Airways     Name Placement date Placement time Site Days   Peripheral IV 01/02/24 22 G 1" Left;Anterior Forearm 01/02/24  2001  Forearm  less than 1             Intake/Output Last 24 hours  Intake/Output Summary (Last 24 hours) at 01/02/2024 2013 Last data filed at 01/02/2024 2013 Gross per 24 hour  Intake 647.55 ml  Output --  Net 647.55 ml    Labs/Imaging Results for orders placed or performed during the hospital encounter of 01/02/24 (from the past 48 hours)  Urinalysis, Routine w reflex microscopic -Urine, Clean Catch     Status: Abnormal   Collection Time: 01/02/24  1:27 PM  Result Value Ref Range   Color, Urine YELLOW YELLOW   APPearance CLEAR CLEAR   Specific Gravity, Urine 1.020 1.005 - 1.030   pH >=9.0 5.0 - 8.0   Glucose, UA NEGATIVE NEGATIVE mg/dL   Hgb urine dipstick NEGATIVE NEGATIVE   Bilirubin Urine NEGATIVE NEGATIVE   Ketones, ur 40 (A) NEGATIVE mg/dL   Protein, ur 213 (A) NEGATIVE mg/dL   Nitrite NEGATIVE NEGATIVE   Leukocytes,Ua NEGATIVE NEGATIVE    Comment: Performed at [facility], [address] [place], [place] [address]  Urinalysis, Microscopic (reflex)     Status: Abnormal   Collection Time: 01/02/24  1:27 PM  Result Value Ref Range   RBC / HPF 0-5 0 - 5 RBC/hpf   WBC, UA NONE SEEN 0 - 5 WBC/hpf   Bacteria, UA RARE (A) NONE SEEN   Squamous Epithelial / HPF 0-5 0 - 5 /HPF    Comment: Performed at [facility], [address] [place], [place] [address]  Lipase, blood     Status: None   Collection Time: 01/02/24  1:30 PM  Result Value Ref Range   Lipase 26 11 - 51 U/L    Comment: Performed at [facility], [address] [place], [place] [address]  Comprehensive metabolic panel     Status: Abnormal   Collection Time: 01/02/24  1:30 PM  Result Value Ref Range   Sodium 137 135 - 145 mmol/L   Potassium 3.2 (L) 3.5 - 5.1 mmol/L   Chloride 105 98 - 111 mmol/L   CO2 19 (L) 22 - 32 mmol/L   Glucose, Bld 111 (H) 70 - 99 mg/dL    Comment: Glucose reference range applies only to samples taken after fasting for at least 8 hours.   BUN 8 6 - 20 mg/dL    Creatinine, Ser 5.78 0.44 - 1.00 mg/dL   Calcium 9.3 8.9 - 46.9 mg/dL   Total Protein 7.5 6.5 - 8.1 g/dL   Albumin 4.2 3.5 - 5.0 g/dL   AST 48 (H) 15 - 41 U/L   ALT 30 0 - 44 U/L   Alkaline Phosphatase 46 38 - 126 U/L   Total Bilirubin 0.8 0.0 - 1.2 mg/dL   GFR, Estimated >62 >95 mL/min    Comment: (NOTE) Calculated using the CKD-EPI Creatinine Equation (2021)    Anion gap 13 5 - 15    Comment: Performed at [facility], [address] [place], [place] [address]  CBC     Status: Abnormal   Collection Time: 01/02/24  1:30 PM  Result Value Ref Range   WBC 10.4 4.0 - 10.5 K/uL   RBC 4.47 3.87 - 5.11 MIL/uL   Hemoglobin 11.9 (L) 12.0 - 15.0 g/dL   HCT 24.4 01.0 - 27.2 %   MCV 81.7 80.0 - 100.0 fL   MCH 26.6 26.0 - 34.0 pg   MCHC 32.6 30.0 - 36.0 g/dL   RDW 53.6 64.4 - 03.4 %   Platelets 314 150 - 400 K/uL   nRBC 0.0 0.0 - 0.2 %    Comment: Performed at [facility], [address] [place], [place] [address]  Resp panel by RT-PCR (RSV, Flu A&B, Covid) Anterior Nasal Swab     Status: None   Collection Time: 01/02/24  1:30 PM   Specimen: Anterior Nasal Swab  Result Value Ref Range   SARS Coronavirus 2 by RT PCR NEGATIVE NEGATIVE    Comment: (NOTE) SARS-CoV-2 target nucleic acids are NOT DETECTED.  The SARS-CoV-2 RNA is generally detectable in upper respiratory specimens during the acute phase of infection. The lowest concentration of SARS-CoV-2 viral copies this assay can detect is 138 copies/mL. A negative result does not preclude SARS-Cov-2 infection and should not be used as the sole basis for treatment or other patient management decisions. A negative result may occur with  improper specimen collection/handling, submission of specimen other than nasopharyngeal swab, presence of viral mutation(s) within the areas targeted by this assay, and inadequate number of viral copies(<138 copies/mL). A negative result must be combined with clinical  observations, patient history, and epidemiological information. The expected result is Negative.  Fact Sheet for Patients:  BloggerCourse.com  Fact Sheet for Healthcare Providers:  SeriousBroker.it  This test is no t yet approved or cleared by the [place] FDA and  has been authorized for detection and/or diagnosis of SARS-CoV-2 by FDA under an Emergency  Use Authorization (EUA). This EUA will remain  in effect (meaning this test can be used) for the duration of the COVID-19 declaration under Section 564(b)(1) of the Act, 21 U.S.C.section 360bbb-3(b)(1), unless the authorization is terminated  or revoked sooner.       Influenza A by PCR NEGATIVE NEGATIVE   Influenza B by PCR NEGATIVE NEGATIVE    Comment: (NOTE) The Xpert Xpress SARS-CoV-2/FLU/RSV plus assay is intended as an aid in the diagnosis of influenza from Nasopharyngeal swab specimens and should not be used as a sole basis for treatment. Nasal washings and aspirates are unacceptable for Xpert Xpress SARS-CoV-2/FLU/RSV testing.  Fact Sheet for Patients: BloggerCourse.com  Fact Sheet for Healthcare Providers: SeriousBroker.it  This test is not yet approved or cleared by the [place] FDA and has been authorized for detection and/or diagnosis of SARS-CoV-2 by FDA under an Emergency Use Authorization (EUA). This EUA will remain in effect (meaning this test can be used) for the duration of the COVID-19 declaration under Section 564(b)(1) of the Act, 21 U.S.C. section 360bbb-3(b)(1), unless the authorization is terminated or revoked.     Resp Syncytial Virus by PCR NEGATIVE NEGATIVE    Comment: (NOTE) Fact Sheet for Patients: BloggerCourse.com  Fact Sheet for Healthcare Providers: SeriousBroker.it  This test is not yet approved or cleared by the [place]  FDA and has been authorized for detection and/or diagnosis of SARS-CoV-2 by FDA under an Emergency Use Authorization (EUA). This EUA will remain in effect (meaning this test can be used) for the duration of the COVID-19 declaration under Section 564(b)(1) of the Act, 21 U.S.C. section 360bbb-3(b)(1), unless the authorization is terminated or revoked.  Performed at [facility], [address] [place], [place] [address]   Pregnancy, urine     Status: None   Collection Time: 01/02/24  1:59 PM  Result Value Ref Range   Preg Test, Ur NEGATIVE NEGATIVE    Comment:        THE SENSITIVITY OF THIS METHODOLOGY IS >25 mIU/mL. Performed at [facility], [address] [place], [place] [address]   Magnesium     Status: None   Collection Time: 01/02/24  4:09 PM  Result Value Ref Range   Magnesium 1.9 1.7 - 2.4 mg/dL    Comment: Performed at [facility], [address] [place], [place] [address]   CT ABDOMEN PELVIS W CONTRAST Result Date: 01/02/2024 CLINICAL DATA:  Abdominal pain. EXAM: CT ABDOMEN AND PELVIS WITH CONTRAST TECHNIQUE: Multidetector CT imaging of the abdomen and pelvis was performed using the standard protocol following bolus administration of intravenous contrast. RADIATION DOSE REDUCTION: This exam was performed according to the departmental dose-optimization program which includes automated exposure control, adjustment of the mA and/or kV according to patient size and/or use of iterative reconstruction technique. CONTRAST:  OMNIPAQUE IOHEXOL 300 MG/ML  SOLN COMPARISON:  CT abdomen pelvis dated 12/07/2023. FINDINGS: Lower chest: The visualized lung bases are clear. No intra-abdominal free air or free fluid. Hepatobiliary: Pancreas: Unremarkable. No pancreatic ductal dilatation or surrounding inflammatory changes. Spleen: Normal in size without focal abnormality. Adrenals/Urinary Tract: The adrenal glands, kidneys, visualized ureters, and urinary  bladder appear unremarkable. Stomach/Bowel: No bowel obstruction or active inflammation. The appendix is normal. Vascular/Lymphatic: The abdominal aorta and IVC are unremarkable. No portal venous gas. There is no adenopathy. Reproductive: The uterus is anteverted. A 3 cm left ovarian dominant follicle or corpus luteum. No imaging follow-up. The right  ovary is unremarkable. Other: None Musculoskeletal: No acute or significant osseous findings. IMPRESSION: 1. No acute intra-abdominal or pelvic pathology. 2. A 3 cm left ovarian dominant follicle or corpus luteum. No imaging follow-up. Electronically Signed   By: Elgie Collard M.D.   On: 01/02/2024 18:58   DG Chest 2 View Result Date: 01/02/2024 CLINICAL DATA:  Chest pain emesis EXAM: CHEST - 2 VIEW COMPARISON:  None Available. FINDINGS: The heart size and mediastinal contours are within normal limits. Both lungs are clear. The visualized skeletal structures are unremarkable. IMPRESSION: No active cardiopulmonary disease. Electronically Signed   By: Jasmine Pang M.D.   On: 01/02/2024 16:30    Pending Labs Unresulted Labs (From admission, onward)    None       Vitals/Pain Today's Vitals   01/02/24 1325 01/02/24 1622 01/02/24 1714 01/02/24 1715  BP: 132/80 120/70    Pulse: 77 66    Resp: 15 15    Temp: 98.7 F (37.1 C)  98.5 F (36.9 C)   TempSrc:   Oral   SpO2: 100% 100%    Weight:      Height:      PainSc:    3     Isolation Precautions No active isolations  Medications Medications  pantoprazole (PROTONIX) injection 40 mg (40 mg Intravenous Given 01/02/24 1627)  metoCLOPramide (REGLAN) injection 5 mg (5 mg Intravenous Given 01/02/24 1633)  lactated ringers bolus 1,000 mL (0 mLs Intravenous Stopped 01/02/24 2013)  droperidol (INAPSINE) 2.5 MG/ML injection 0.625 mg (0.625 mg Intravenous Given 01/02/24 1712)  diphenhydrAMINE (BENADRYL) injection 25 mg (25 mg Intravenous Given 01/02/24 1711)  potassium chloride SA (KLOR-CON M) CR  tablet 40 mEq (40 mEq Oral Given 01/02/24 1826)  alum & mag hydroxide-simeth (MAALOX/MYLANTA) 200-200-20 MG/5ML suspension 30 mL (30 mLs Oral Given 01/02/24 1826)  iohexol (OMNIPAQUE) 300 MG/ML solution 100 mL (100 mLs Intravenous Contrast Given 01/02/24 1802)    Mobility walks     Focused Assessments Cardiac Assessment Handoff:    No results found for: "CKTOTAL", "CKMB", "CKMBINDEX", "TROPONINI" No results found for: "DDIMER" Does the Patient currently have chest pain? No   Nausea and vomiting- unable to take PO s    R Recommendations: See Admitting Provider Note  Report given to:   Additional Notes:

## 2024-01-02 NOTE — Assessment & Plan Note (Signed)
 CT abdomen unremarkable likely in the setting of gastroenteritis Pain control
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
## 2024-01-02 NOTE — ED Notes (Signed)
 Pt declined ODT zofran at time of triage.

## 2024-01-02 NOTE — Subjective & Objective (Addendum)
 Patient was first seen in the emergency department 24 March with nausea after eating wings associated with some left-sided abdominal pain Denies smoking or drug use no fevers no chills Had recent use of heavy alcohol but states that is not typical for her   Patient presented again today complaining of ongoing emesis associate some chest pain lightheadedness states that she cannot take her by mouth medications. States has ongoing emesis did endorse at some point she had a small amount of blood in vomit yesterday no Melena No travel antibiotics use Lipase was unremarkable noted to have slightly low potassium at 3.2   CT abd non acute  chest x-ray unremarkable Patient was administered GI cocktail LR bolus and potassium in the emergency department and right hospitalist was consulted for intractable nausea vomiting

## 2024-01-02 NOTE — Assessment & Plan Note (Signed)
 Supportive measures Will rehydrate CT abdomen unremarkable Given patient now developed diarrhea will order gastric panel for viral illness

## 2024-01-03 DIAGNOSIS — E876 Hypokalemia: Secondary | ICD-10-CM | POA: Diagnosis present

## 2024-01-03 DIAGNOSIS — A084 Viral intestinal infection, unspecified: Secondary | ICD-10-CM | POA: Diagnosis present

## 2024-01-03 DIAGNOSIS — F19139 Other psychoactive substance abuse with withdrawal, unspecified: Secondary | ICD-10-CM | POA: Diagnosis present

## 2024-01-03 DIAGNOSIS — R824 Acetonuria: Secondary | ICD-10-CM | POA: Diagnosis present

## 2024-01-03 DIAGNOSIS — M6282 Rhabdomyolysis: Secondary | ICD-10-CM | POA: Diagnosis present

## 2024-01-03 DIAGNOSIS — R079 Chest pain, unspecified: Secondary | ICD-10-CM | POA: Diagnosis present

## 2024-01-03 DIAGNOSIS — E860 Dehydration: Secondary | ICD-10-CM | POA: Diagnosis present

## 2024-01-03 DIAGNOSIS — R112 Nausea with vomiting, unspecified: Secondary | ICD-10-CM | POA: Diagnosis present

## 2024-01-03 DIAGNOSIS — Z79899 Other long term (current) drug therapy: Secondary | ICD-10-CM | POA: Diagnosis not present

## 2024-01-03 DIAGNOSIS — Z1152 Encounter for screening for COVID-19: Secondary | ICD-10-CM | POA: Diagnosis not present

## 2024-01-03 DIAGNOSIS — K529 Noninfective gastroenteritis and colitis, unspecified: Secondary | ICD-10-CM | POA: Diagnosis present

## 2024-01-03 LAB — CBC
HCT: 34 % — ABNORMAL LOW (ref 36.0–46.0)
Hemoglobin: 11.1 g/dL — ABNORMAL LOW (ref 12.0–15.0)
MCH: 26.6 pg (ref 26.0–34.0)
MCHC: 32.6 g/dL (ref 30.0–36.0)
MCV: 81.3 fL (ref 80.0–100.0)
Platelets: 298 10*3/uL (ref 150–400)
RBC: 4.18 MIL/uL (ref 3.87–5.11)
RDW: 13.8 % (ref 11.5–15.5)
WBC: 10.2 10*3/uL (ref 4.0–10.5)
nRBC: 0 % (ref 0.0–0.2)

## 2024-01-03 LAB — LACTIC ACID, PLASMA: Lactic Acid, Venous: 1.1 mmol/L (ref 0.5–1.9)

## 2024-01-03 LAB — BASIC METABOLIC PANEL WITH GFR
Anion gap: 11 (ref 5–15)
BUN: 6 mg/dL (ref 6–20)
CO2: 18 mmol/L — ABNORMAL LOW (ref 22–32)
Calcium: 8.7 mg/dL — ABNORMAL LOW (ref 8.9–10.3)
Chloride: 105 mmol/L (ref 98–111)
Creatinine, Ser: 0.93 mg/dL (ref 0.44–1.00)
GFR, Estimated: >60 mL/min (ref >60)
Glucose, Bld: 92 mg/dL (ref 70–99)
Potassium: 3.2 mmol/L — ABNORMAL LOW (ref 3.5–5.1)
Sodium: 134 mmol/L — ABNORMAL LOW (ref 135–145)

## 2024-01-03 LAB — GASTROINTESTINAL PANEL BY PCR, STOOL (REPLACES STOOL CULTURE)

## 2024-01-03 LAB — PHOSPHORUS: Phosphorus: 2.4 mg/dL — ABNORMAL LOW (ref 2.5–4.6)

## 2024-01-03 LAB — COMPREHENSIVE METABOLIC PANEL WITH GFR
ALT: 28 U/L (ref 0–44)
AST: 40 U/L (ref 15–41)
Albumin: 3.9 g/dL (ref 3.5–5.0)
Alkaline Phosphatase: 40 U/L (ref 38–126)
Anion gap: 12 (ref 5–15)
BUN: 7 mg/dL (ref 6–20)
CO2: 19 mmol/L — ABNORMAL LOW (ref 22–32)
Calcium: 9.1 mg/dL (ref 8.9–10.3)
Chloride: 106 mmol/L (ref 98–111)
Creatinine, Ser: 1.04 mg/dL — ABNORMAL HIGH (ref 0.44–1.00)
GFR, Estimated: >60 mL/min (ref >60)
Glucose, Bld: 98 mg/dL (ref 70–99)
Potassium: 3.1 mmol/L — ABNORMAL LOW (ref 3.5–5.1)
Sodium: 137 mmol/L (ref 135–145)
Total Bilirubin: 1 mg/dL (ref 0.0–1.2)
Total Protein: 6.8 g/dL (ref 6.5–8.1)

## 2024-01-03 LAB — HIV ANTIBODY (ROUTINE TESTING W REFLEX): HIV Screen 4th Generation wRfx: NONREACTIVE

## 2024-01-03 LAB — MAGNESIUM: Magnesium: 1.8 mg/dL (ref 1.7–2.4)

## 2024-01-03 MED ORDER — ZOLPIDEM TARTRATE 5 MG PO TABS
5.0000 mg | ORAL_TABLET | Freq: Every evening | ORAL | Status: DC | PRN
  Administered 2024-01-03: 5 mg via ORAL
  Filled 2024-01-03: qty 1

## 2024-01-03 MED ORDER — POTASSIUM CHLORIDE CRYS ER 20 MEQ PO TBCR
40.0000 meq | EXTENDED_RELEASE_TABLET | Freq: Once | ORAL | Status: AC
  Administered 2024-01-03: 40 meq via ORAL
  Filled 2024-01-03: qty 2

## 2024-01-03 MED ORDER — POTASSIUM CHLORIDE 10 MEQ/100ML IV SOLN
10.0000 meq | INTRAVENOUS | Status: DC
  Filled 2024-01-03: qty 100

## 2024-01-03 MED ORDER — METOCLOPRAMIDE HCL 5 MG/ML IJ SOLN
10.0000 mg | Freq: Three times a day (TID) | INTRAMUSCULAR | Status: DC
  Administered 2024-01-03 (×2): 10 mg via INTRAVENOUS
  Filled 2024-01-03 (×2): qty 2

## 2024-01-03 MED ORDER — PROCHLORPERAZINE EDISYLATE 10 MG/2ML IJ SOLN
10.0000 mg | Freq: Four times a day (QID) | INTRAMUSCULAR | Status: DC | PRN
  Administered 2024-01-03 – 2024-01-04 (×2): 10 mg via INTRAVENOUS
  Filled 2024-01-03 (×2): qty 2

## 2024-01-03 MED ORDER — LACTATED RINGERS IV BOLUS
500.0000 mL | Freq: Once | INTRAVENOUS | Status: AC
  Administered 2024-01-03: 500 mL via INTRAVENOUS

## 2024-01-03 MED ORDER — SCOPOLAMINE 1 MG/3DAYS TD PT72
1.0000 | MEDICATED_PATCH | TRANSDERMAL | Status: DC
  Administered 2024-01-03: 1.5 mg via TRANSDERMAL
  Filled 2024-01-03: qty 1

## 2024-01-03 MED ORDER — MAGNESIUM SULFATE 2 GM/50ML IV SOLN
2.0000 g | Freq: Once | INTRAVENOUS | Status: AC
  Administered 2024-01-03: 2 g via INTRAVENOUS
  Filled 2024-01-03: qty 50

## 2024-01-03 MED ORDER — LACTATED RINGERS IV BOLUS: 500.0000 mL | Freq: Once | INTRAVENOUS | Status: DC

## 2024-01-03 MED ORDER — SODIUM CHLORIDE 0.9 % IV SOLN: INTRAVENOUS | Status: DC

## 2024-01-03 MED ORDER — LACTATED RINGERS IV BOLUS
1000.0000 mL | Freq: Once | INTRAVENOUS | Status: AC
  Administered 2024-01-03: 1000 mL via INTRAVENOUS
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
## 2024-01-03 NOTE — Plan of Care (Signed)

## 2024-01-03 NOTE — Plan of Care (Signed)
  Problem: Education: Goal: Knowledge of General Education information will improve Description: Including pain rating scale, medication(s)/side effects and non-pharmacologic comfort measures Outcome: Progressing   Problem: Health Behavior/Discharge Planning: Goal: Ability to manage health-related needs will improve Outcome: Progressing   Problem: Activity: Goal: Risk for activity intolerance will decrease Outcome: Progressing   Problem: Nutrition: Goal: Adequate nutrition will be maintained Outcome: Not Progressing   

## 2024-01-03 NOTE — Progress Notes (Signed)
  Patient reporting IV line is burning with IV potassium.  Want to take oral potassium supplement instead of IV potassium. Switching to IV to oral supplement.  Tereasa Coop, MD [facility] 01/03/2024, 3:47 AM

## 2024-01-03 NOTE — TOC Initial Note (Addendum)
 Transition of Care [facility]) - Initial/Assessment Note    Patient Details  Name: Stephanie Glenn MRN: [id number] Date of Birth: 2001/01/14  Transition of Care [facility] CM/SW Contact:    Marliss Coots, LCSW Phone Number: 01/03/2024, 1:13 PM  Clinical Narrative:                  1:13 PM CSW introduced self and role to patient at bedside. CSW informed patient of TOC Consult (substance use education/counseling) and offered relevant resources. Patient declined CSW offer and denied frequent alcohol use. Patient stated she is from apartment and resides with parents. Patient confirmed she has insurance and feels safe at home, where he parents will provide transportation to at discharge. CSW informed patient of need to provide insurance card to admissions to prevent billing.  1:26 PM Patient provided personal phone for CSW to speak with mother, Rosey Bath. CSW informed Rosey Bath that patient's insurance cards on file are from before she became a legal adult. Rosey Bath stated she believes patient has Medicaid as she received a letter in the mail regarding it but did not receive and insurance card. Rosey Bath stated she would attempt to obtain card and bring it to admissions. CSW offered to follow up with financial counseling, which Rosey Bath accepted.  Expected Discharge Plan: Home/Self Care Barriers to Discharge: Continued Medical Work up   Patient Goals and CMS Choice Patient states their goals for this hospitalization and ongoing recovery are:: to return home          Expected Discharge Plan and Services In-house Referral: Clinical Social Work     Living arrangements for the past 2 months: Apartment                                      Prior Living Arrangements/Services Living arrangements for the past 2 months: Apartment Lives with:: Parents Patient language and need for interpreter reviewed:: Yes Do you feel safe going back to the place where you live?: Yes      Need for Family Participation in  Patient Care: No (Comment) Care giver support system in place?: No (comment)   Criminal Activity/Legal Involvement Pertinent to Current Situation/Hospitalization: No - Comment as needed  Activities of Daily Living      Permission Sought/Granted Permission sought to share information with : Family Supports Permission granted to share information with : No (Contact information on chart)  Share Information with NAME: Lew Dawes     Permission granted to share info w Relationship: Mother  Permission granted to share info w Contact Information: [phone]  Emotional Assessment Appearance:: Appears stated age Attitude/Demeanor/Rapport: Engaged Affect (typically observed): Accepting, Adaptable, Stable, Calm, Pleasant Orientation: : Oriented to Self, Oriented to Place, Oriented to  Time, Oriented to Situation Alcohol / Substance Use: Not Applicable Psych Involvement: No (comment)  Admission diagnosis:  Intractable nausea and vomiting [R11.2] Patient Active Problem List   Diagnosis Date Noted   Rhabdomyolysis 01/03/2024   Intractable nausea and vomiting 01/02/2024   Dehydration 01/02/2024   Abdominal pain 01/02/2024   Hypokalemia 01/02/2024   Left knee injury 11/01/2015   PCP:  [facility] Pharm[facility]:   [id number] - [place], [place] - [address] AT [facility] [address] [place] [address] Phone: [phone] Fax: [phone]  [facility] [address] [place]  [address] Phone: [phone] Fax: [phone]     Social Drivers of Health (SDOH) Social History: SDOH Screenings   Food Insecurity: Unknown (01/02/2024)  Housing: Unknown (01/02/2024)  Transportation Needs: Unknown (01/02/2024)  Utilities: Not At Risk (01/02/2024)  Tobacco Use: Medium Risk (12/30/2023)   SDOH Interventions:     Readmission Risk Interventions     No data to display
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
## 2024-01-04 ENCOUNTER — Other Ambulatory Visit (HOSPITAL_COMMUNITY): Payer: Self-pay

## 2024-01-04 DIAGNOSIS — R112 Nausea with vomiting, unspecified: Secondary | ICD-10-CM | POA: Diagnosis not present

## 2024-01-04 LAB — CK: Total CK: 508 U/L — ABNORMAL HIGH (ref 38–234)

## 2024-01-04 MED ORDER — ALTEPLASE 2 MG IJ SOLR
2.0000 mg | Freq: Once | INTRAMUSCULAR | Status: DC
  Filled 2024-01-04: qty 2

## 2024-01-04 MED ORDER — POTASSIUM CHLORIDE CRYS ER 20 MEQ PO TBCR
40.0000 meq | EXTENDED_RELEASE_TABLET | Freq: Once | ORAL | Status: AC
  Administered 2024-01-04: 40 meq via ORAL
  Filled 2024-01-04: qty 2

## 2024-01-04 MED ORDER — STERILE WATER FOR INJECTION IJ SOLN
INTRAMUSCULAR | Status: AC
  Administered 2024-01-04: 10 mL
  Filled 2024-01-04: qty 10

## 2024-01-04 MED ORDER — PANTOPRAZOLE SODIUM 20 MG PO TBEC
20.0000 mg | DELAYED_RELEASE_TABLET | Freq: Every day | ORAL | 0 refills | Status: DC
  Filled 2024-01-04 (×2): qty 14, 14d supply, fill #0

## 2024-01-04 MED ORDER — PROCHLORPERAZINE MALEATE 10 MG PO TABS
10.0000 mg | ORAL_TABLET | Freq: Four times a day (QID) | ORAL | 0 refills | Status: DC | PRN
  Filled 2024-01-04: qty 30, 8d supply, fill #0

## 2024-01-04 MED ORDER — LACTATED RINGERS IV BOLUS
1000.0000 mL | Freq: Once | INTRAVENOUS | Status: AC
  Administered 2024-01-04: 1000 mL via INTRAVENOUS

## 2024-01-04 NOTE — Plan of Care (Signed)
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
## 2024-01-04 NOTE — Progress Notes (Signed)
 Discussed discharge information with patient, patient are of new medication as well as medication schedule . Pt aware of follow up appointments. Pt aware of s/s to report to provider. Teachback method enforced, all questions answered. Pt with TOC medication in had as well as all personal belongings

## 2024-06-22 ENCOUNTER — Other Ambulatory Visit (HOSPITAL_COMMUNITY)
Admission: RE | Admit: 2024-06-22 | Discharge: 2024-06-22 | Disposition: A | Discharge status: Home / Self Care | Source: Ambulatory Visit | Attending: Obstetrics and Gynecology | Admitting: Obstetrics and Gynecology

## 2024-06-22 ENCOUNTER — Other Ambulatory Visit: Payer: Self-pay

## 2024-06-22 ENCOUNTER — Ambulatory Visit (INDEPENDENT_AMBULATORY_CARE_PROVIDER_SITE_OTHER)

## 2024-06-22 VITALS — BP 116/70 | HR 112 | Wt 148.0 lb

## 2024-06-22 DIAGNOSIS — Z3401 Encounter for supervision of normal first pregnancy, first trimester: Secondary | ICD-10-CM

## 2024-06-22 DIAGNOSIS — Z3400 Encounter for supervision of normal first pregnancy, unspecified trimester: Secondary | ICD-10-CM | POA: Insufficient documentation

## 2024-06-22 NOTE — Progress Notes (Signed)
 New OB Intake  I explained I am completing New OB Intake today. We discussed EDD of 02/09/2025, by Last Menstrual Period. Pt is G1P0. I reviewed her allergies, medications and Medical/Surgical/OB history.    Patient Active Problem List   Diagnosis Date Noted   Encounter for supervision of normal first pregnancy in first trimester 06/22/2024   Rhabdomyolysis 01/03/2024   Intractable nausea and vomiting 01/02/2024   Dehydration 01/02/2024   Abdominal pain 01/02/2024   Hypokalemia 01/02/2024   Left knee injury 11/01/2015    Concerns addressed today  Patient informed that the ultrasound is considered a limited obstetric ultrasound and is not intended to be a complete ultrasound exam.  Patient also informed that the ultrasound is not being completed with the intent of assessing for fetal or placental anomalies or any pelvic abnormalities. Explained that the purpose of today's ultrasound is to assess for viability.  Patient acknowledges the purpose of the exam and the limitations of the study.     Delivery Plans Plans to deliver at [facility]. Discussed the nature of our practice with multiple providers including residents and students. Due to the size of the practice, the delivering provider may not be the same as those providing prenatal care.   MyChart/Babyscripts MyChart access verified. I explained pt will have some visits in office and some virtually. Babyscripts app discussed and ordered.   Blood Pressure Cuff Blood pressure cuff discussedDiscussed to be used for virtual visits and or if needed BP checks weekly.  Anatomy US  Explained first scheduled US  will be around 19 weeks.   Last Pap No results found for: DIAGPAP  First visit review I reviewed new OB appt with patient. Explained pt will be seen by Harlene Duncans at first visit. Discussed [facility] genetic screening with patient. Routine prenatal labs ordered.    Erminio DELENA Rumps, [place] 06/22/2024  2:40 PM

## 2024-06-23 ENCOUNTER — Ambulatory Visit: Payer: Self-pay | Admitting: Obstetrics and Gynecology

## 2024-06-23 DIAGNOSIS — D75839 Thrombocytosis, unspecified: Secondary | ICD-10-CM | POA: Insufficient documentation

## 2024-06-23 DIAGNOSIS — O99019 Anemia complicating pregnancy, unspecified trimester: Secondary | ICD-10-CM | POA: Insufficient documentation

## 2024-06-23 LAB — CBC/D/PLT+RPR+RH+ABO+RUBIGG...
Antibody Screen: NEGATIVE
Basophils Absolute: 0.1 x10E3/uL (ref 0.0–0.2)
Basos: 1 %
EOS (ABSOLUTE): 0.1 x10E3/uL (ref 0.0–0.4)
Eos: 1 %
HCV Ab: NONREACTIVE
HIV Screen 4th Generation wRfx: NONREACTIVE
Hematocrit: 37 % (ref 34.0–46.6)
Hemoglobin: 10.9 g/dL — ABNORMAL LOW (ref 11.1–15.9)
Hepatitis B Surface Ag: NEGATIVE
Immature Grans (Abs): 0 x10E3/uL (ref 0.0–0.1)
Immature Granulocytes: 0 %
Lymphocytes Absolute: 2.1 x10E3/uL (ref 0.7–3.1)
Lymphs: 19 %
MCH: 25.2 pg — ABNORMAL LOW (ref 26.6–33.0)
MCHC: 29.5 g/dL — ABNORMAL LOW (ref 31.5–35.7)
MCV: 86 fL (ref 79–97)
Monocytes Absolute: 1 x10E3/uL — ABNORMAL HIGH (ref 0.1–0.9)
Monocytes: 9 %
Neutrophils Absolute: 7.7 x10E3/uL — ABNORMAL HIGH (ref 1.4–7.0)
Neutrophils: 70 %
Platelets: 603 x10E3/uL — ABNORMAL HIGH (ref 150–450)
RBC: 4.32 x10E6/uL (ref 3.77–5.28)
RDW: 13.9 % (ref 11.7–15.4)
RPR Ser Ql: NONREACTIVE
Rh Factor: POSITIVE
Rubella Antibodies, IGG: 2.55 {index} (ref >0.99)
WBC: 10.9 x10E3/uL — ABNORMAL HIGH (ref 3.4–10.8)

## 2024-06-23 LAB — HCV INTERPRETATION

## 2024-06-23 NOTE — Telephone Encounter (Signed)
-----   Message from Rollo ONEIDA Bring sent at 06/23/2024 10:58 AM EDT ----- Please ask lab to add anemia panel for patient. (Iron, B12, folate) ----- Message ----- From: Interface, [facility] Lab Results In Sent: 06/23/2024  10:36 AM EDT To: Rollo ONEIDA Bring, MD

## 2024-06-23 NOTE — Telephone Encounter (Signed)
 Patient called the office to discuss lab results. Patient made aware that her labs are normal but she is anemic and her platelets are elevated. Informed patient that an anemia panel will be added to her labs and she will receive a call after her other labs have resulted. Understanding was voiced. Dallana Mavity l Kameran Mcneese, CMA

## 2024-06-24 ENCOUNTER — Other Ambulatory Visit

## 2024-06-24 ENCOUNTER — Other Ambulatory Visit: Payer: Self-pay

## 2024-06-24 ENCOUNTER — Other Ambulatory Visit: Payer: Self-pay | Admitting: Obstetrics and Gynecology

## 2024-06-24 DIAGNOSIS — Z3A08 8 weeks gestation of pregnancy: Secondary | ICD-10-CM

## 2024-06-24 DIAGNOSIS — Z3401 Encounter for supervision of normal first pregnancy, first trimester: Secondary | ICD-10-CM

## 2024-06-24 LAB — SPECIMEN STATUS REPORT

## 2024-06-24 LAB — CERVICOVAGINAL ANCILLARY ONLY
Chlamydia: NEGATIVE
Comment: NEGATIVE
Comment: NORMAL
Neisseria Gonorrhea: NEGATIVE

## 2024-06-24 LAB — B12 AND FOLATE PANEL
Folate: 17.7 ng/mL (ref >3.0)
Vitamin B-12: 466 pg/mL (ref 232–1245)

## 2024-06-24 LAB — URINE CULTURE, OB REFLEX

## 2024-06-24 LAB — IRON: Iron: 37 ug/dL (ref 27–159)

## 2024-06-24 LAB — CULTURE, OB URINE

## 2024-06-24 MED ORDER — FERROUS SULFATE 325 (65 FE) MG PO TBEC: 325.0000 mg | DELAYED_RELEASE_TABLET | ORAL | 3 refills | Status: AC

## 2024-06-24 NOTE — Addendum Note (Signed)
 Addended by: Torrez Renfroe T on: 06/24/2024 07:33 PM   Modules accepted: Orders

## 2024-07-21 ENCOUNTER — Ambulatory Visit (INDEPENDENT_AMBULATORY_CARE_PROVIDER_SITE_OTHER)

## 2024-07-21 VITALS — BP 124/66 | HR 95 | Wt 157.0 lb

## 2024-07-21 DIAGNOSIS — O99019 Anemia complicating pregnancy, unspecified trimester: Secondary | ICD-10-CM

## 2024-07-21 DIAGNOSIS — Z3401 Encounter for supervision of normal first pregnancy, first trimester: Secondary | ICD-10-CM

## 2024-07-21 DIAGNOSIS — D75839 Thrombocytosis, unspecified: Secondary | ICD-10-CM | POA: Diagnosis not present

## 2024-07-21 DIAGNOSIS — Z3481 Encounter for supervision of other normal pregnancy, first trimester: Secondary | ICD-10-CM

## 2024-07-21 DIAGNOSIS — Z3A12 12 weeks gestation of pregnancy: Secondary | ICD-10-CM | POA: Diagnosis not present

## 2024-07-21 NOTE — Progress Notes (Signed)
 Subjective:   Stephanie Glenn is a 23 y.o. G1P0 at [redacted]w[redacted]d by Definite  LMP of April 27, 2024 being seen today for her first obstetrical visit.  Patient states this was an unplanned pregnancy.  Patient reports she was not on birth control prior to conception.  She has taken the patches in the past, but states it was for cycle regulation.   Gynecological/Obstetrical History: Patient denies history of gynecological surgeries.  Patient denies history of abnormal pap smears.   Pregnancy history fully reviewed. Patient does intend to breast  and bottlefeed. Patient obstetrical history is unremarkable.   Sexual Activity and Vaginal Concerns: Patient is not currently sexually active.  She denies vaginal discharge, bleeding, irritation, or odor. Patient also denies pain or difficulty with urination, but notes some pressure.     Medical History/ROS: Patient denies medical history significant for cardiovascular, respiratory, gastrointestinal, or hematological disorders. Patient also denies history of MH disorders including anxiety and/or depression.  Patient reports no complaints.  Patient denies constipation/diarrhea or nausea/vomiting.  No recurrent headaches.    Social History: Patient denies history or current usage of tobacco, alcohol, or drugs.  She reports history of vaping. Patient reports the FOB is Bosco who is involved and supportive.  Patient states Alford has moved to [place], [place] and is currently incarcerated.   Patient reports that she lives with mom and endorses safety at home.  Patient denies DV/A. Patient is not currently employed.  HISTORY: OB History  Gravida Para Term Preterm AB Living  1 0 0 0 0 0  SAB IAB Ectopic Multiple Live Births  0 0 0 0 0    # Outcome Date GA Lbr Len/2nd Weight Sex Type Anes PTL Lv  1 Current             Last pap smear was done last year and was normal.   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 does not live with him full-time  Vaping Use   Vaping status: Former  Substance Use Topics   Alcohol use: Not Currently   Drug use: Not Currently    Types: Marijuana   No Known Allergies Current Outpatient Medications on File Prior to Visit  Medication Sig Dispense Refill   ferrous sulfate  325 (65 FE) MG EC tablet Take 1 tablet (325 mg total) by mouth every other day. 30 tablet 3   Prenatal Vit-Fe Fumarate-FA (MULTIVITAMIN-PRENATAL) 27-0.8 MG TABS tablet Take 1 tablet by mouth daily at 12 noon.     ondansetron  (ZOFRAN ) 4 MG tablet Take 1 tablet (4 mg total) by mouth every 8 (eight) hours as needed. 6 tablet 0   pantoprazole  (PROTONIX ) 20 MG tablet Take 1 tablet (20 mg total) by mouth daily for 14 days. 14 tablet 0  prochlorperazine  (COMPAZINE ) 10 MG tablet Take 1 tablet (10 mg total) by mouth every 6 (six) hours as needed for nausea or vomiting. 30 tablet 0   No current facility-administered medications on file prior to visit.    Review of Systems Pertinent items noted in HPI and remainder of comprehensive ROS otherwise negative.  Exam   Vitals:   07/21/24 1313  BP: 124/66  Pulse: 95  Weight: 157 lb (71.2 kg)      Physical Exam Constitutional:      Appearance: Normal appearance.  HENT:     Head: Normocephalic and atraumatic.  Eyes:     Conjunctiva/sclera: Conjunctivae normal.  Cardiovascular:      Rate and Rhythm: Normal rate.     Heart sounds: Normal heart sounds.  Pulmonary:     Effort: Pulmonary effort is normal. No respiratory distress.     Breath sounds: Normal breath sounds.  Abdominal:     Palpations: Abdomen is soft.     Tenderness: There is abdominal tenderness (Mild generalized).  Musculoskeletal:        General: Normal range of motion.     Cervical back: Normal range of motion.  Neurological:     Mental Status: She is alert and oriented to person, place, and time.  Skin:    General: Skin is warm and dry.  Psychiatric:        Mood and Affect: Mood normal.        Behavior: Behavior normal.  Vitals reviewed.     Assessment:   23 y.o. year old G1P0 Patient Active Problem List   Diagnosis Date Noted   Antepartum anemia 06/23/2024   Thrombocytosis 06/23/2024   Encounter for supervision of normal first pregnancy in first trimester 06/22/2024   Rhabdomyolysis 01/03/2024   Intractable nausea and vomiting 01/02/2024   Dehydration 01/02/2024   Abdominal pain 01/02/2024   Hypokalemia 01/02/2024   Left knee injury 11/01/2015     Plan:  1. Encounter for supervision of normal first pregnancy in first trimester -Congratulations given and patient welcomed to practice. -Discussed potential usage of virtual visits as additional source of managing and completing PN visits.   -Reviewed prenatal visit schedule and platforms used for virtual visits.  -Anticipatory guidance for interventions during prenatal visits including labs, ultrasounds, and testing; Initial labs drawn 9/15 with [facility]. -Genetic Screening discussed, First trimester screen: ordered. -Ultrasound discussed; fetal anatomic survey: ordered. -Discussed estimated due date of February 01, 2025 . -Continue prenatal vitamins  -Influenza offered and declined. -Encouraged to seek out care at office or emergency room for urgent and/or emergent concerns. -Educated on the nature of [facility]  Faculty Practice with multiple MDs and other Advanced Practice Providers was explained to patient; also emphasized that residents, students are part of our team. Informed of her right to refuse care as she deems appropriate.  -Encouraged to complete and utilize MyChart Registration for her ability to review results, send requests, and have questions addressed.  -No questions or concerns.    2. [redacted] weeks gestation of pregnancy -Doing well.   3. Antepartum anemia -Taking iron supplement every other day.   4. Thrombocytosis -Plts at 603 9/15 -Repeat CBC ordered today.     Problem list reviewed and updated. Routine obstetric precautions reviewed.  Orders Placed This Encounter  Procedures   HgB A1c   HORIZON CUSTOM    ==========Department Information========== ID: [id number] Department:[facility]  [facility] [place] [address] [place] [address] Dept: [phone] Dept Fax: [phone]     Specify the name or ID of a valid Horizon Custom Panel::   HBASIC    Is patient pregnant?:   Yes    Ethnicity of patient::   African American    Patient authorizes Jennell to share patient's Horizon test results with partner and their medical provider?:   No    Practice ensures that HIPAA consent is obtained and will make available to [facility] upon request?:   Yes    By placing this electronic order I confirm the testing ordered herein is medically necessary and this patient has been informed of the details of the genetic test(s) ordered, including the risks, benefits, and alternatives, and has consented to testing.:   Yes    What type of billing?:   [employer] an order diagnosis: For additional options refer to [email or web address].[email or web address]:   Encounter for supervision of other normal pregnancy, first trimester [[id number]]    Tay-S[employer] add-on test?:   No    CC Results:   Aleila Syverson,  Rithvik Orcutt [[id number]]   PANORAMA PRENATAL TEST    ==========Department Information========== ID: [id number] Department:[facility]              [facility] [place] [address] [place] [address] Dept: [phone] Dept Fax: [phone]     Method/type of collection::   Clinic to manage sample collection    Expected due date (MM/DD/YYYY)::   02/09/2025    Is this a twin pregnancy? (viable, no vanished twin):   Not Twin Pregnancy, Gerri    Is this a surrogate or egg donor pregnancy?:   No    I want fetal sex included in the report::   Yes    For RhD (-) patients only, do you want fetal RhD reported?:   Yes    Maternal Weight (lbs)::   157    Which Microdeletion Panel should be ordered?:   None    Enroll this patient in the Automatic Redraw Program?:   No    What type of billing?:   Automatic Data    By placing this electronic order I confirm the testing ordered herein is medically necessary and this patient has been informed of the details of the genetic test(s) ordered, including the risks, benefits, and alternatives, and has consented to testing.:   Yes    Select an order diagnosis: For additional options refer to [email or web address].[email or web address]:   Encounter for supervision of normal first pregnancy in first trimester [[id number]]    CC Results:   Saniyah Mondesir [[id number]]   CBC    No follow-ups on file.     Harlene LITTIE Duncans, CNM 07/21/2024 1:26 PM

## 2024-07-22 LAB — HEMOGLOBIN A1C
Est. average glucose Bld gHb Est-mCnc: 91 mg/dL
Hgb A1c MFr Bld: 4.8 % (ref 4.8–5.6)

## 2024-07-22 LAB — CBC
Hematocrit: 39.6 % (ref 34.0–46.6)
Hemoglobin: 12.2 g/dL (ref 11.1–15.9)
MCH: 27.2 pg (ref 26.6–33.0)
MCHC: 30.8 g/dL — ABNORMAL LOW (ref 31.5–35.7)
MCV: 88 fL (ref 79–97)
Platelets: 454 x10E3/uL — ABNORMAL HIGH (ref 150–450)
RBC: 4.49 x10E6/uL (ref 3.77–5.28)
RDW: 16.5 % — ABNORMAL HIGH (ref 11.7–15.4)
WBC: 10 x10E3/uL (ref 3.4–10.8)

## 2024-07-23 ENCOUNTER — Ambulatory Visit: Payer: Self-pay

## 2024-07-26 LAB — PANORAMA PRENATAL TEST FULL PANEL:PANORAMA TEST PLUS 5 ADDITIONAL MICRODELETIONS: FETAL FRACTION: 5.9

## 2024-07-29 ENCOUNTER — Telehealth: Payer: Self-pay

## 2024-07-29 LAB — HORIZON CUSTOM: REPORT SUMMARY: NEGATIVE

## 2024-07-29 NOTE — Telephone Encounter (Signed)
 Patient called with concerns about Horizon test. Patient wasn't sure if test was negative or positive. Informed patient that her Horizon test result is negative. Understanding was voiced. Tj Kitchings l Keryl Gholson, CMA

## 2024-08-18 ENCOUNTER — Ambulatory Visit: Admitting: Obstetrics and Gynecology

## 2024-08-18 VITALS — BP 113/64 | HR 79 | Wt 163.1 lb

## 2024-08-18 DIAGNOSIS — Z3A16 16 weeks gestation of pregnancy: Secondary | ICD-10-CM | POA: Diagnosis not present

## 2024-08-18 DIAGNOSIS — Z3400 Encounter for supervision of normal first pregnancy, unspecified trimester: Secondary | ICD-10-CM

## 2024-08-18 NOTE — Patient Instructions (Signed)

## 2024-08-18 NOTE — Progress Notes (Signed)
   PRENATAL VISIT NOTE  Subjective:  Stephanie Glenn is a 23 y.o. G1P0 at [redacted]w[redacted]d being seen today for ongoing prenatal care.  She is currently monitored for the following issues for this low-risk pregnancy and has Left knee injury; Intractable nausea and vomiting; Dehydration; Abdominal pain; Hypokalemia; Rhabdomyolysis; Supervision of normal first pregnancy, antepartum; Antepartum anemia; and Thrombocytosis on their problem list.  Patient reports back pain if standing long period of time. Acne   Contractions: Not present. Vag. Bleeding: None.   . Denies leaking of fluid.   The following portions of the patient's history were reviewed and updated as appropriate: allergies, current medications, past family history, past medical history, past social history, past surgical history and problem list.   Objective:   Vitals:   08/18/24 1059  BP: 113/64  Pulse: 79  Weight: 163 lb 1.9 oz (74 kg)    Fetal Status:  Fetal Heart Rate (bpm): 154        Assessment and Plan:  Pregnancy: G1P0 at [redacted]w[redacted]d 1. [redacted] weeks gestation of pregnancy (Primary) AFP today Declined flu Discussed safe wash for acne in pregnancy Discussed supportive measures for back pain, encouraged maternity belt Peds list provided  - AFP, Serum, Open Spina Bifida  2. Supervision of normal first pregnancy, antepartum BP and FHR normal    Preterm labor symptoms and general obstetric precautions including but not limited to vaginal bleeding, contractions, leaking of fluid and fetal movement were reviewed in detail with the patient. Please refer to After Visit Summary for other counseling recommendations.     Future Appointments  Date Time Provider Department Center  09/14/2024  2:00 PM [facility] PROVIDER 1 [facility]  09/14/2024  2:30 PM [facility] US4 [facility]  09/15/2024 11:15 AM Larwence Mliss SAILOR, PA-C [facility] None    Nidia Daring, FNP

## 2024-08-20 ENCOUNTER — Ambulatory Visit: Payer: Self-pay | Admitting: Obstetrics and Gynecology

## 2024-08-20 DIAGNOSIS — Z3400 Encounter for supervision of normal first pregnancy, unspecified trimester: Secondary | ICD-10-CM

## 2024-08-20 LAB — AFP, SERUM, OPEN SPINA BIFIDA
AFP MoM: 0.8
AFP Value: 27.5 ng/mL
Gest. Age on Collection Date: 16 wk
Maternal Age At EDD: 23.4 a
OSBR Risk 1 IN: 10000
Test Results:: NEGATIVE
Weight: 163 [lb_av]

## 2024-08-25 ENCOUNTER — Ambulatory Visit

## 2024-08-26 ENCOUNTER — Other Ambulatory Visit (HOSPITAL_COMMUNITY)
Admission: RE | Admit: 2024-08-26 | Discharge: 2024-08-26 | Disposition: A | Source: Ambulatory Visit | Attending: Obstetrics & Gynecology | Admitting: Obstetrics & Gynecology

## 2024-08-26 ENCOUNTER — Ambulatory Visit

## 2024-08-26 VITALS — BP 114/61 | HR 107 | Ht 61.0 in | Wt 166.0 lb

## 2024-08-26 DIAGNOSIS — N898 Other specified noninflammatory disorders of vagina: Secondary | ICD-10-CM | POA: Diagnosis present

## 2024-08-26 NOTE — Progress Notes (Signed)
 SUBJECTIVE:  23 y.o. female who desires a STI screen. Denies abnormal vaginal discharge, bleeding or significant pelvic pain. No UTI symptoms. Complaining of vaginal itching on and off.  Denies history of known exposure to STD.  Patient's last menstrual period was 04/27/2024.  OBJECTIVE:  She appears well.   ASSESSMENT:  STI Screen   PLAN:  Pt offered STI blood screening-declined GC, chlamydia, and trichomonas probe sent to lab.  Treatment: To be determined once lab results are received.  Pt follow up as needed.   Erminio DELENA Rumps, RN

## 2024-08-28 ENCOUNTER — Ambulatory Visit: Payer: Self-pay | Admitting: Obstetrics & Gynecology

## 2024-08-28 DIAGNOSIS — O23592 Infection of other part of genital tract in pregnancy, second trimester: Secondary | ICD-10-CM

## 2024-08-28 LAB — CERVICOVAGINAL ANCILLARY ONLY
Bacterial Vaginitis (gardnerella): POSITIVE — AB
Candida Glabrata: NEGATIVE
Candida Vaginitis: POSITIVE — AB
Chlamydia: NEGATIVE
Comment: NEGATIVE
Comment: NEGATIVE
Comment: NEGATIVE
Comment: NEGATIVE
Comment: NEGATIVE
Comment: NORMAL
Neisseria Gonorrhea: NEGATIVE
Trichomonas: NEGATIVE

## 2024-08-28 MED ORDER — METRONIDAZOLE 500 MG PO TABS: 500.0000 mg | ORAL_TABLET | Freq: Two times a day (BID) | ORAL | 0 refills | Status: AC

## 2024-08-28 MED ORDER — TERCONAZOLE 0.4 % VA CREA: 1.0000 | TOPICAL_CREAM | Freq: Every day | VAGINAL | 0 refills | Status: AC

## 2024-09-09 ENCOUNTER — Ambulatory Visit: Admitting: Obstetrics and Gynecology

## 2024-09-09 ENCOUNTER — Ambulatory Visit

## 2024-09-09 VITALS — BP 126/65 | HR 81

## 2024-09-09 DIAGNOSIS — Z3A19 19 weeks gestation of pregnancy: Secondary | ICD-10-CM

## 2024-09-09 DIAGNOSIS — Z3400 Encounter for supervision of normal first pregnancy, unspecified trimester: Secondary | ICD-10-CM

## 2024-09-09 DIAGNOSIS — Z3401 Encounter for supervision of normal first pregnancy, first trimester: Secondary | ICD-10-CM

## 2024-09-09 DIAGNOSIS — Z3A12 12 weeks gestation of pregnancy: Secondary | ICD-10-CM

## 2024-09-09 DIAGNOSIS — O99019 Anemia complicating pregnancy, unspecified trimester: Secondary | ICD-10-CM

## 2024-09-09 NOTE — Progress Notes (Signed)
 After review, MFM consult with provider is not indicated for today  Arna Ranks, MD 09/09/2024 3:59 PM  [facility]

## 2024-09-14 ENCOUNTER — Ambulatory Visit

## 2024-09-14 ENCOUNTER — Other Ambulatory Visit

## 2024-09-15 ENCOUNTER — Encounter: Payer: Self-pay | Admitting: Medical

## 2024-09-15 ENCOUNTER — Ambulatory Visit: Admitting: Medical

## 2024-09-15 VITALS — BP 107/72 | HR 78 | Wt 173.0 lb

## 2024-09-15 DIAGNOSIS — Z3A20 20 weeks gestation of pregnancy: Secondary | ICD-10-CM

## 2024-09-15 DIAGNOSIS — Z3400 Encounter for supervision of normal first pregnancy, unspecified trimester: Secondary | ICD-10-CM

## 2024-09-15 NOTE — Progress Notes (Signed)
   PRENATAL VISIT NOTE  Subjective:  Jestine Bicknell is a 23 y.o. G1P0 at [redacted]w[redacted]d being seen today for ongoing prenatal care.  She is currently monitored for the following issues for this low-risk pregnancy and has Left knee injury; Intractable nausea and vomiting; Abdominal pain; Hypokalemia; Rhabdomyolysis; Supervision of normal first pregnancy, antepartum; Antepartum anemia; and Thrombocytosis on their problem list.  Patient reports no complaints.  Contractions: Not present. Vag. Bleeding: None.  Movement: Present. Denies leaking of fluid.   The following portions of the patient's history were reviewed and updated as appropriate: allergies, current medications, past family history, past medical history, past social history, past surgical history and problem list.   Objective:   Vitals:   09/15/24 1153  BP: 107/72  Pulse: 78    Fetal Status:  Fetal Heart Rate (bpm): 150   Movement: Present    General: Alert, oriented and cooperative. Patient is in no acute distress.  Skin: Skin is warm and dry. No rash noted.   Cardiovascular: Normal heart rate noted  Respiratory: Normal respiratory effort, no problems with respiration noted  Abdomen: Soft, gravid, appropriate for gestational age.  Pain/Pressure: Absent     Pelvic: Cervical exam deferred        Extremities: Normal range of motion.  Edema: None  Mental Status: Normal mood and affect. Normal behavior. Normal judgment and thought content.      07/21/2024    1:30 PM  Depression screen PHQ 2/9  Decreased Interest 0  Down, Depressed, Hopeless 0  PHQ - 2 Score 0  Altered sleeping 0  Tired, decreased energy 0  Change in appetite 0  Feeling bad or failure about yourself  0  Trouble concentrating 0  Moving slowly or fidgety/restless 0  Suicidal thoughts 0  PHQ-9 Score 0      Data saved with a previous flowsheet row definition        07/21/2024    1:30 PM  GAD 7 : Generalized Anxiety Score  Nervous, Anxious, on Edge 0   Control/stop worrying 0  Worry too much - different things 0  Trouble relaxing 0  Restless 0  Easily annoyed or irritable 0  Afraid - awful might happen 1  Total GAD 7 Score 1    Assessment and Plan:  Pregnancy: G1P0 at [redacted]w[redacted]d 1. Supervision of normal first pregnancy, antepartum (Primary) - CBC - Discussed peds, has list  - US  reviewed   2. [redacted] weeks gestation of pregnancy   Preterm labor symptoms and general obstetric precautions including but not limited to vaginal bleeding, contractions, leaking of fluid and fetal movement were reviewed in detail with the patient. Please refer to After Visit Summary for other counseling recommendations.   Return in about 4 weeks (around 10/13/2024) for LOB, any provider, In-Person.  Future Appointments  Date Time Provider Department Center  10/14/2024  9:15 AM Stinson, Jacob J, DO [facility] None  11/10/2024  8:35 AM Synthia Raisin, CNM [facility] None  11/24/2024 10:55 AM Synthia Raisin, CNM [facility] None  12/08/2024 11:15 AM Synthia Raisin, CNM [facility] None    Mliss Rinne, PA-C

## 2024-09-16 ENCOUNTER — Ambulatory Visit: Payer: Self-pay | Admitting: Medical

## 2024-09-16 LAB — CBC
Hematocrit: 38.5 % (ref 34.0–46.6)
Hemoglobin: 12.2 g/dL (ref 11.1–15.9)
MCH: 29.8 pg (ref 26.6–33.0)
MCHC: 31.7 g/dL (ref 31.5–35.7)
MCV: 94 fL (ref 79–97)
Platelets: 362 x10E3/uL (ref 150–450)
RBC: 4.1 x10E6/uL (ref 3.77–5.28)
RDW: 13.2 % (ref 11.7–15.4)
WBC: 10 x10E3/uL (ref 3.4–10.8)

## 2024-10-14 ENCOUNTER — Ambulatory Visit: Admitting: Family Medicine

## 2024-10-14 VITALS — BP 112/66 | HR 100 | Wt 184.0 lb

## 2024-10-14 DIAGNOSIS — Z3400 Encounter for supervision of normal first pregnancy, unspecified trimester: Secondary | ICD-10-CM | POA: Diagnosis not present

## 2024-10-14 DIAGNOSIS — Z3A24 24 weeks gestation of pregnancy: Secondary | ICD-10-CM

## 2024-10-14 MED ORDER — FAMOTIDINE 20 MG PO TABS: 20.0000 mg | ORAL_TABLET | Freq: Two times a day (BID) | ORAL | 3 refills | Status: AC

## 2024-10-14 NOTE — Progress Notes (Signed)
" ° °  PRENATAL VISIT NOTE  Subjective:  Stephanie Glenn is a 24 y.o. G1P0 at 109w2d being seen today for ongoing prenatal care.  She is currently monitored for the following issues for this low-risk pregnancy and has Left knee injury; Intractable nausea and vomiting; Abdominal pain; Hypokalemia; Rhabdomyolysis; Supervision of normal first pregnancy, antepartum; and Thrombocytosis on their problem list.  Patient reports no complaints.  Contractions: Not present. Vag. Bleeding: None.  Movement: Present. Denies leaking of fluid.   The following portions of the patient's history were reviewed and updated as appropriate: allergies, current medications, past family history, past medical history, past social history, past surgical history and problem list.   Objective:   Vitals:   10/14/24 0923  BP: 112/66  Pulse: 100  Weight: 184 lb (83.5 kg)    Fetal Status:  Fetal Heart Rate (bpm): 156   Movement: Present    General: Alert, oriented and cooperative. Patient is in no acute distress.  Skin: Skin is warm and dry. No rash noted.   Cardiovascular: Normal heart rate noted  Respiratory: Normal respiratory effort, no problems with respiration noted  Abdomen: Soft, gravid, appropriate for gestational age.  Pain/Pressure: Present     Pelvic: Cervical exam deferred        Extremities: Normal range of motion.  Edema: None  Mental Status: Normal mood and affect. Normal behavior. Normal judgment and thought content.      07/21/2024    1:30 PM  Depression screen PHQ 2/9  Decreased Interest 0  Down, Depressed, Hopeless 0  PHQ - 2 Score 0  Altered sleeping 0  Tired, decreased energy 0  Change in appetite 0  Feeling bad or failure about yourself  0  Trouble concentrating 0  Moving slowly or fidgety/restless 0  Suicidal thoughts 0  PHQ-9 Score 0      Data saved with a previous flowsheet row definition        07/21/2024    1:30 PM  GAD 7 : Generalized Anxiety Score  Nervous, Anxious, on Edge 0   Control/stop worrying 0  Worry too much - different things 0  Trouble relaxing 0  Restless 0  Easily annoyed or irritable 0  Afraid - awful might happen 1  Total GAD 7 Score 1    Assessment and Plan:  Pregnancy: G1P0 at [redacted]w[redacted]d 1. [redacted] weeks gestation of pregnancy (Primary)  2. Supervision of normal first pregnancy, antepartum FHT normal  Preterm labor symptoms and general obstetric precautions including but not limited to vaginal bleeding, contractions, leaking of fluid and fetal movement were reviewed in detail with the patient. Please refer to After Visit Summary for other counseling recommendations.   No follow-ups on file.  Future Appointments  Date Time Provider Department Center  11/10/2024  8:35 AM Synthia Raisin, CNM [facility] None  11/24/2024 10:55 AM Synthia Raisin, CNM [facility] None  12/08/2024 11:15 AM Synthia Raisin, CNM [facility] None    Levana Minetti J Raheen Capili, DO "

## 2024-10-15 ENCOUNTER — Encounter: Payer: Self-pay | Admitting: Family Medicine

## 2024-11-03 ENCOUNTER — Encounter (HOSPITAL_COMMUNITY): Payer: Self-pay | Admitting: Obstetrics and Gynecology

## 2024-11-03 ENCOUNTER — Inpatient Hospital Stay (HOSPITAL_COMMUNITY)
Admission: AD | Admit: 2024-11-03 | Discharge: 2024-11-03 | Disposition: A | Discharge status: Home / Self Care | Attending: Obstetrics and Gynecology | Admitting: Obstetrics and Gynecology

## 2024-11-03 DIAGNOSIS — O26892 Other specified pregnancy related conditions, second trimester: Secondary | ICD-10-CM

## 2024-11-03 DIAGNOSIS — Z3A27 27 weeks gestation of pregnancy: Secondary | ICD-10-CM

## 2024-11-03 DIAGNOSIS — O9A212 Injury, poisoning and certain other consequences of external causes complicating pregnancy, second trimester: Secondary | ICD-10-CM | POA: Diagnosis not present

## 2024-11-03 DIAGNOSIS — R109 Unspecified abdominal pain: Secondary | ICD-10-CM | POA: Insufficient documentation
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
## 2024-11-10 ENCOUNTER — Encounter

## 2024-11-11 ENCOUNTER — Ambulatory Visit: Admitting: Obstetrics and Gynecology

## 2024-11-11 VITALS — BP 115/56 | HR 79 | Wt 196.1 lb

## 2024-11-11 DIAGNOSIS — Z3A28 28 weeks gestation of pregnancy: Secondary | ICD-10-CM

## 2024-11-11 DIAGNOSIS — Z3400 Encounter for supervision of normal first pregnancy, unspecified trimester: Secondary | ICD-10-CM

## 2024-11-11 NOTE — Progress Notes (Signed)
" ° °  PRENATAL VISIT NOTE  Subjective:  Stephanie Glenn is a 24 y.o. G1P0 at [redacted]w[redacted]d being seen today for ongoing prenatal care.  She is currently monitored for the following issues for this low-risk pregnancy and has Left knee injury; Intractable nausea and vomiting; Abdominal pain; Hypokalemia; Rhabdomyolysis; Supervision of normal first pregnancy, antepartum; and Thrombocytosis on their problem list.  Patient reports no complaints.  Contractions: Not present. Vag. Bleeding: None.  Movement: Present. Denies leaking of fluid.   The following portions of the patient's history were reviewed and updated as appropriate: allergies, current medications, past family history, past medical history, past social history, past surgical history and problem list.   Objective:   Vitals:   11/11/24 0856  BP: (!) 115/56  Pulse: 79  Weight: 196 lb 1.9 oz (89 kg)    Fetal Status:  Fetal Heart Rate (bpm): 156 Fundal Height: 28 cm Movement: Present    General: Alert, oriented and cooperative. Patient is in no acute distress.  Skin: Skin is warm and dry. No rash noted.   Cardiovascular: Normal heart rate noted  Respiratory: Normal respiratory effort, no problems with respiration noted  Abdomen: Soft, gravid, appropriate for gestational age.  Pain/Pressure: Absent     Pelvic: Cervical exam deferred        Extremities: Normal range of motion.  Edema: None  Mental Status: Normal mood and affect. Normal behavior. Normal judgment and thought content.   Assessment and Plan:  Pregnancy: G1P0 at [redacted]w[redacted]d 1. Supervision of normal first pregnancy, antepartum (Primary) Anticipatory guidance Fetal kick counts reviewed Tdap today Plans to breastfeed, reviewed pumps Declines flu shot today but states she will do next visit - CBC - Glucose Tolerance, 2 Hours w/1 Hour - HIV Antibody (routine testing w rflx) - RPR W/RFLX TO RPR TITER, TREPONEMAL AB, SCREEN AND DIAGNOSIS - Tdap vaccine greater than or equal to 7yo IM  2.  [redacted] weeks gestation of pregnancy  Preterm labor symptoms and general obstetric precautions including but not limited to vaginal bleeding, contractions, leaking of fluid and fetal movement were reviewed in detail with the patient. Please refer to After Visit Summary for other counseling recommendations.   Return in about 2 weeks (around 11/25/2024).  Future Appointments  Date Time Provider Department Center  11/24/2024 10:55 AM Synthia Raisin, CNM [facility] None  12/08/2024 11:15 AM Synthia Raisin, CNM [facility] None    Rollo ONEIDA Bring, MD "

## 2024-11-12 ENCOUNTER — Ambulatory Visit: Payer: Self-pay | Admitting: Obstetrics and Gynecology

## 2024-11-12 LAB — HIV ANTIBODY (ROUTINE TESTING W REFLEX): HIV Screen 4th Generation wRfx: NONREACTIVE

## 2024-11-12 LAB — CBC
Hematocrit: 36.2 % (ref 34.0–46.6)
Hemoglobin: 11.2 g/dL (ref 11.1–15.9)
MCH: 29.2 pg (ref 26.6–33.0)
MCHC: 30.9 g/dL — ABNORMAL LOW (ref 31.5–35.7)
MCV: 94 fL (ref 79–97)
Platelets: 371 10*3/uL (ref 150–450)
RBC: 3.84 x10E6/uL (ref 3.77–5.28)
RDW: 12 % (ref 11.7–15.4)
WBC: 9.5 10*3/uL (ref 3.4–10.8)

## 2024-11-12 LAB — GLUCOSE TOLERANCE, 2 HOURS W/ 1HR
Glucose, 1 hour: 107 mg/dL (ref 70–179)
Glucose, 2 hour: 54 mg/dL — ABNORMAL LOW (ref 70–152)
Glucose, Fasting: 80 mg/dL (ref 70–91)

## 2024-11-12 LAB — SYPHILIS: RPR W/REFLEX TO RPR TITER AND TREPONEMAL ANTIBODIES, TRADITIONAL SCREENING AND DIAGNOSIS ALGORITHM: RPR Ser Ql: NONREACTIVE

## 2024-11-24 ENCOUNTER — Encounter

## 2024-12-08 ENCOUNTER — Encounter

## 2024-12-24 ENCOUNTER — Encounter: Admitting: Family Medicine

## 2025-01-05 ENCOUNTER — Encounter

## 2025-01-12 ENCOUNTER — Encounter

## 2025-01-19 ENCOUNTER — Encounter

## 2025-01-25 ENCOUNTER — Encounter

## 2025-02-03 ENCOUNTER — Encounter
